# Patient Record
Sex: Male | Born: 1945 | Race: White | Hispanic: No | Marital: Married | Smoking: Former smoker
Health system: Southern US, Community
[De-identification: ages and names within clinical notes are randomized; demographics above are authoritative.]

## PROBLEM LIST (undated history)

## (undated) DIAGNOSIS — I493 Ventricular premature depolarization: Secondary | ICD-10-CM

## (undated) DIAGNOSIS — Z9289 Personal history of other medical treatment: Secondary | ICD-10-CM

## (undated) DIAGNOSIS — I10 Essential (primary) hypertension: Secondary | ICD-10-CM

## (undated) HISTORY — PX: MOUTH SURGERY: SHX715

## (undated) HISTORY — DX: Essential (primary) hypertension: I10

## (undated) HISTORY — DX: Personal history of other medical treatment: Z92.89

## (undated) HISTORY — DX: Ventricular premature depolarization: I49.3

---

## 2003-02-27 ENCOUNTER — Emergency Department (HOSPITAL_COMMUNITY): Admission: AC | Admit: 2003-02-27 | Discharge: 2003-02-27 | Payer: Self-pay

## 2003-02-27 ENCOUNTER — Encounter: Payer: Self-pay | Admitting: Emergency Medicine

## 2007-05-18 ENCOUNTER — Emergency Department (HOSPITAL_COMMUNITY): Admission: EM | Admit: 2007-05-18 | Discharge: 2007-05-18 | Payer: Self-pay | Admitting: Family Medicine

## 2007-05-26 ENCOUNTER — Emergency Department (HOSPITAL_COMMUNITY): Admission: EM | Admit: 2007-05-26 | Discharge: 2007-05-26 | Payer: Self-pay | Admitting: Family Medicine

## 2008-07-08 IMAGING — CR DG CHEST 2V
2 series · 2 of 2 positions shown · non-contrast
Comparison: none

CLINICAL DATA: Chest pain

Chest 2 view:
Comparison 02/27/2003. Old right rib fractures. Chronic linear scarring or
atelectasis in the right middle lobe. Lungs otherwise clear. Heart size normal.
No effusion.

[w chest pa]
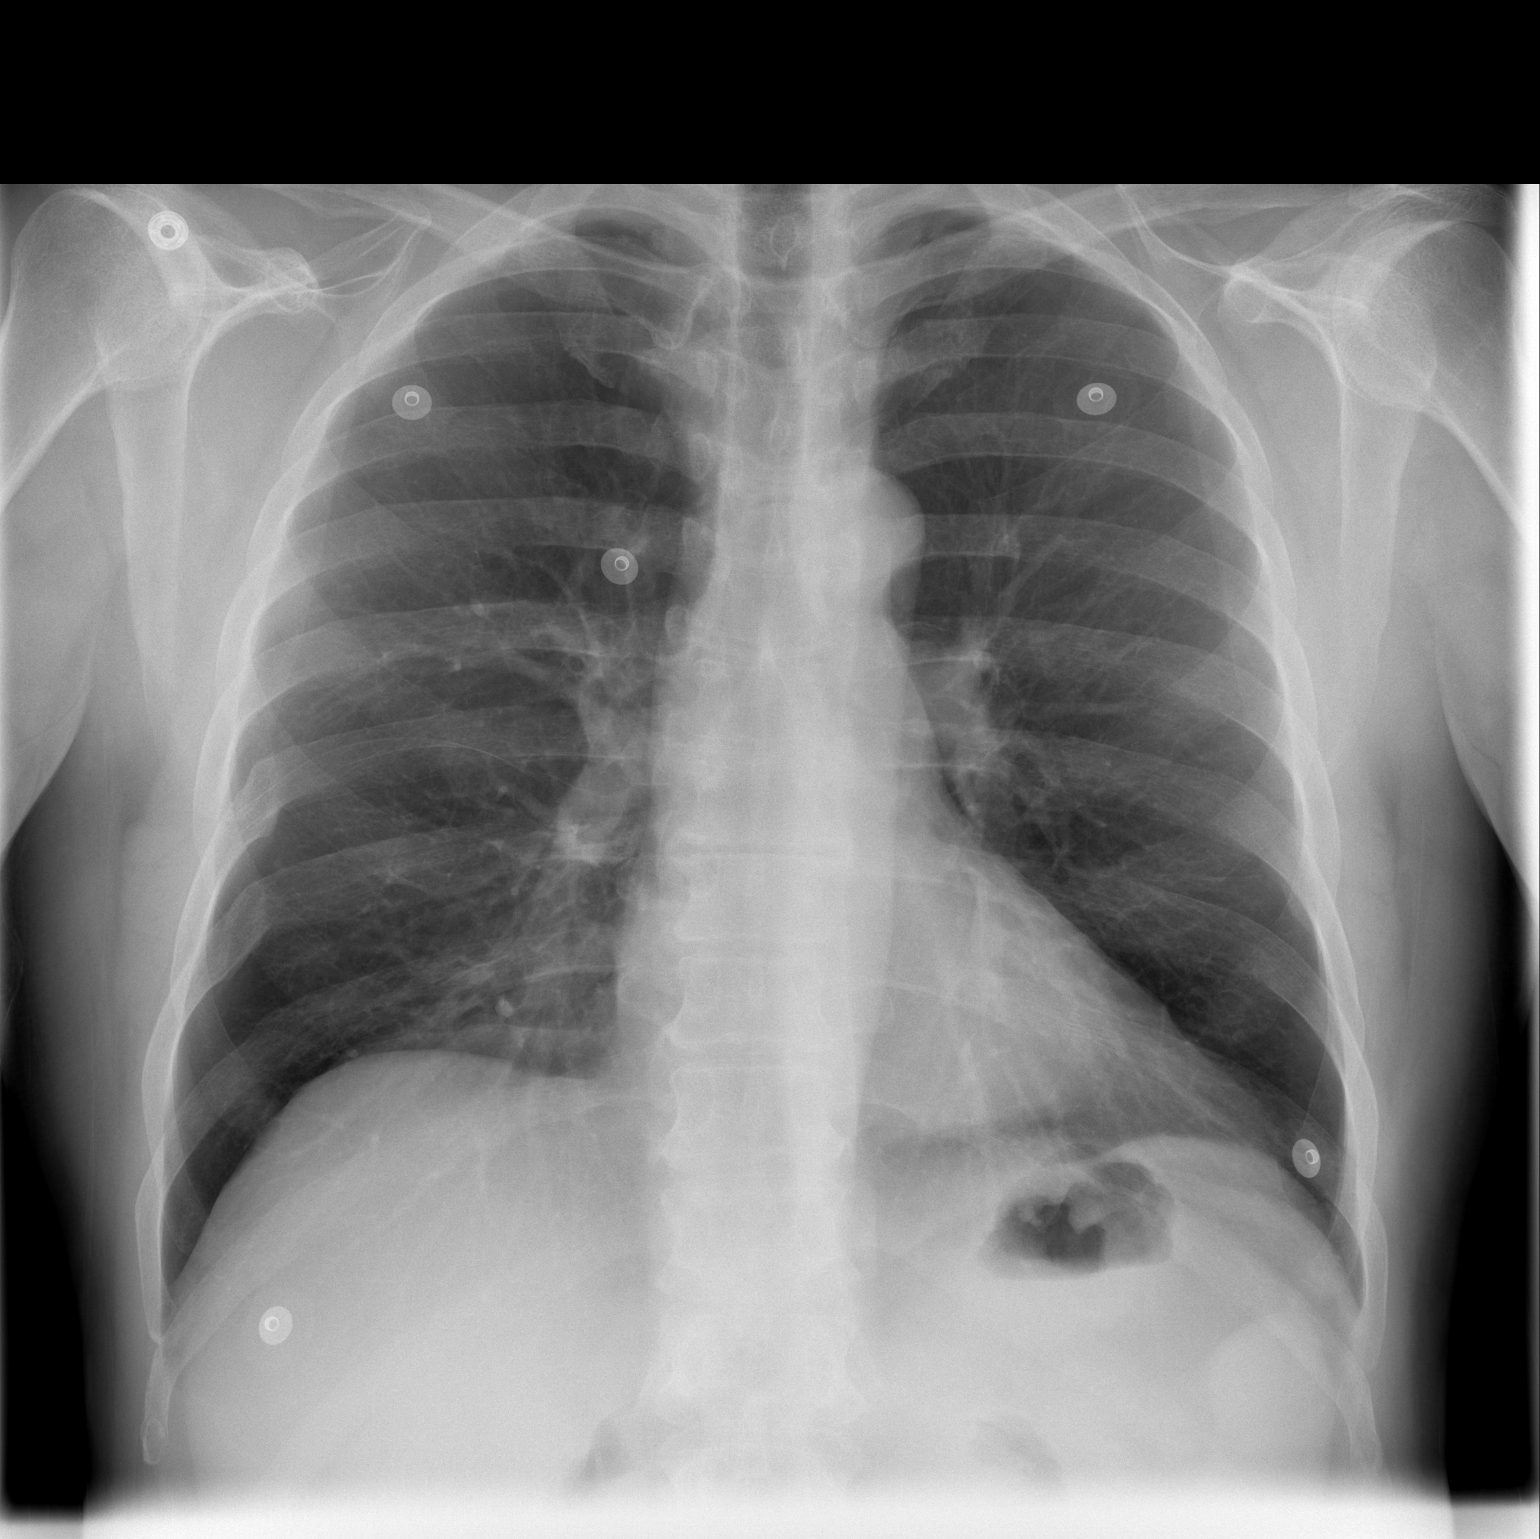

[w chest lat]
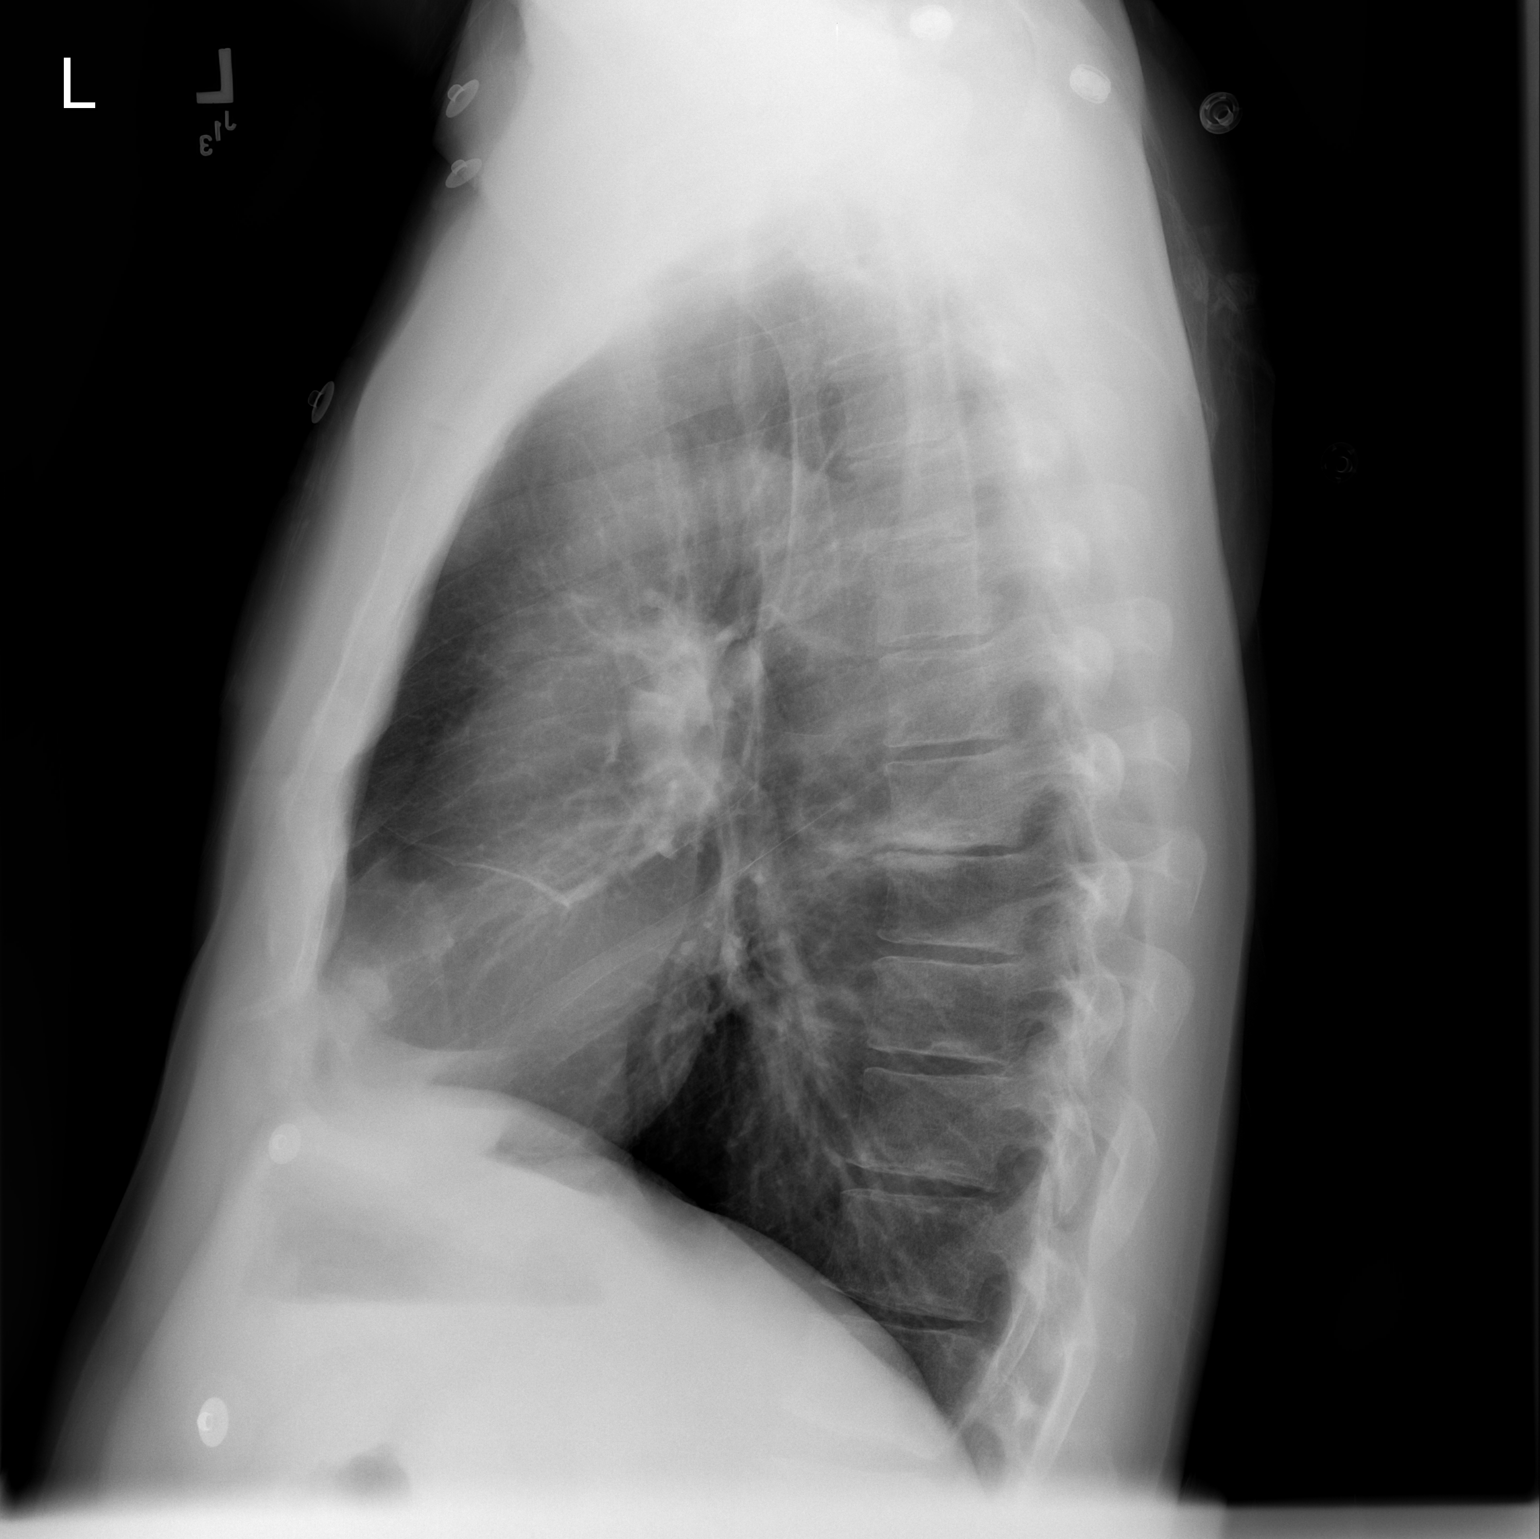

[2 of 2 positions shown; findings below may reference images not displayed]

IMPRESSION: 1. No acute disease

## 2011-09-21 LAB — DIFFERENTIAL
Basophils Absolute: 0
Basophils Relative: 0
Eosinophils Absolute: 0
Eosinophils Relative: 0
Lymphocytes Relative: 15
Lymphs Abs: 1.3
Monocytes Absolute: 0.6
Monocytes Relative: 7
Neutro Abs: 6.8
Neutrophils Relative %: 78 — ABNORMAL HIGH

## 2011-09-21 LAB — CBC
HCT: 42
Hemoglobin: 14.8
MCHC: 35.1
MCV: 92.2
Platelets: 272
RBC: 4.55
RDW: 13
WBC: 8.7

## 2014-04-29 ENCOUNTER — Encounter: Payer: Self-pay | Admitting: Cardiology

## 2014-06-08 ENCOUNTER — Telehealth: Payer: Self-pay | Admitting: *Deleted

## 2014-06-08 MED ORDER — METOPROLOL SUCCINATE ER 25 MG PO TB24: 25.0000 mg | ORAL_TABLET | Freq: Every day | ORAL | Status: DC

## 2014-06-08 NOTE — Telephone Encounter (Signed)
Rx sent in for 15 day supply. Pt needs to keep appt later this month to receive more refills.

## 2014-06-08 NOTE — Telephone Encounter (Signed)
Patient requests metoprolol refill be sent to [facility] on [address] in [place]. Thanks, MI

## 2014-06-18 ENCOUNTER — Encounter: Payer: Self-pay | Admitting: *Deleted

## 2014-06-19 ENCOUNTER — Ambulatory Visit: Payer: Self-pay | Admitting: Cardiology

## 2014-06-22 ENCOUNTER — Ambulatory Visit (INDEPENDENT_AMBULATORY_CARE_PROVIDER_SITE_OTHER): Payer: 59 | Admitting: Physician Assistant

## 2014-06-22 ENCOUNTER — Encounter: Payer: Self-pay | Admitting: Physician Assistant

## 2014-06-22 VITALS — BP 156/89 | HR 66 | Ht 70.5 in | Wt 169.0 lb

## 2014-06-22 DIAGNOSIS — I10 Essential (primary) hypertension: Secondary | ICD-10-CM

## 2014-06-22 DIAGNOSIS — I4949 Other premature depolarization: Secondary | ICD-10-CM

## 2014-06-22 DIAGNOSIS — I493 Ventricular premature depolarization: Secondary | ICD-10-CM

## 2014-06-22 MED ORDER — METOPROLOL SUCCINATE ER 25 MG PO TB24: 25.0000 mg | ORAL_TABLET | Freq: Every day | ORAL | Status: DC

## 2014-06-22 NOTE — Progress Notes (Signed)
   Cardiology Office Note    Date:  06/22/2014   ID:  Steven Dorsey, DOB [birth year], MRN [id number]  PCP:  No primary provider on file.  Cardiologist:  Dr. Fransico Him      History of Present Illness: Steven Dorsey is a 68 y.o. male a history of PVCs, HTN.  The patient denies any chest pain, significant dyspnea, syncope, orthopnea, PND, edema.he does not check his blood pressure that much. He does have a history of " white coat hypertension."     Studies:  - Echo (06/27/07):  EF 55%, mild aortic sclerosis  - Nuclear (06/27/07):  Diaphragmatic attenuation versus very small area of inferior ischemia, EF 64%, normal wall motion   Recent Labs: No results found for requested labs within last 365 days.  Wt Readings from Last 3 Encounters:  06/22/14 169 lb (76.658 kg)     Past Medical History  Diagnosis Date  . Hypertension   . PVC's (premature ventricular contractions)   . Hx of echocardiogram     Echo (06/27/07):  EF 55%, mild aortic sclerosis  . Hx of cardiovascular stress test     Nuclear (06/27/07):  Diaphragmatic attenuation versus very small area of inferior ischemia, EF 64%, normal wall motion    Current Outpatient Prescriptions  Medication Sig Dispense Refill  . metoprolol succinate (TOPROL XL) 25 MG 24 hr tablet Take 1 tablet (25 mg total) by mouth daily.  15 tablet  0   No current facility-administered medications for this visit.    Allergies:   Review of patient's allergies indicates not on file.   Social History:  The patient  reports that he has been smoking.  He does not have any smokeless tobacco history on file.   Family History:  The patient's family history includes Stroke in his sister.   ROS:  Please see the history of present illness.       All other systems reviewed and negative.   PHYSICAL EXAM: VS:  BP 156/89  Pulse 66  Ht 5' 10.5" (1.791 m)  Wt 169 lb (76.658 kg)  BMI 23.90 kg/m2 Well nourished, well developed, in no acute  distress HEENT: normal Neck:  no JVD Vascular: No carotid bruits Cardiac:  normal S1, S2;  RRR; no murmur Lungs:   clear to auscultation bilaterally, no wheezing, rhonchi or rales Abd: soft, nontender, no hepatomegaly Ext:  no edema Skin: warm and dry Neuro:  CNs 2-12 intact, no focal abnormalities noted  EKG:  NSR, HR 66, normal axis, no ST changes     ASSESSMENT AND PLAN:  1. Unspecified essential hypertension: Blood pressure uncontrolled. However, he does have white coat hypertension. I have asked him to monitor his blood pressures at home. He will call us in 2 weeks with a list of his readings. If his blood pressure remains greater than 140/90, consider adding amlodipine. 2. PVC's (premature ventricular contractions) - Continue beta blocker 3. Disposition: Followup with Dr. Radford Pax in one year   Signed, Versie Starks, MHS 06/22/2014 2:22 PM    [facility] [place], [place], [place]  [address] Phone: [phone]; Fax: [phone]

## 2014-06-22 NOTE — Patient Instructions (Signed)
CHECK BP DAILY FOR THE NEXT 2 WEEKS AND CALL DR. Short Pump, [phone]  Your physician wants you to follow-up in: [place] will receive a reminder letter in the mail two months in advance. If you don't receive a letter, please call our office to schedule the follow-up appointment.

## 2015-06-20 ENCOUNTER — Other Ambulatory Visit: Payer: Self-pay | Admitting: Physician Assistant

## 2015-06-21 ENCOUNTER — Telehealth: Payer: Self-pay | Admitting: Cardiology

## 2015-06-21 ENCOUNTER — Other Ambulatory Visit: Payer: Self-pay

## 2015-06-21 DIAGNOSIS — I10 Essential (primary) hypertension: Secondary | ICD-10-CM

## 2015-06-21 MED ORDER — METOPROLOL SUCCINATE ER 25 MG PO TB24: 25.0000 mg | ORAL_TABLET | Freq: Every day | ORAL | Status: DC

## 2015-06-21 NOTE — Telephone Encounter (Signed)
Spoke with patient and he has an appointment on 07/12/2015 with Dr. Radford Pax. Refilled Metoprolol for 30 days.

## 2015-06-21 NOTE — Telephone Encounter (Signed)
NEwMessage  Pt calling about BP medicine refill. Pt also needs to update our system about changing pharmacies. Please call back and discuss.

## 2015-07-14 ENCOUNTER — Encounter: Payer: Self-pay | Admitting: Cardiology

## 2015-07-14 ENCOUNTER — Ambulatory Visit (INDEPENDENT_AMBULATORY_CARE_PROVIDER_SITE_OTHER): Payer: 59 | Admitting: Cardiology

## 2015-07-14 VITALS — BP 130/76 | HR 71 | Ht 70.5 in | Wt 159.0 lb

## 2015-07-14 DIAGNOSIS — I10 Essential (primary) hypertension: Secondary | ICD-10-CM

## 2015-07-14 DIAGNOSIS — I493 Ventricular premature depolarization: Secondary | ICD-10-CM | POA: Diagnosis not present

## 2015-07-14 NOTE — Patient Instructions (Signed)

## 2015-07-14 NOTE — Progress Notes (Signed)
Cardiology Office Note   Date:  07/14/2015   ID:  Steven Dorsey, DOB Apr 14, 1946, MRN [id number]  PCP:  No PCP Per Patient    Chief Complaint  Patient presents with  . Follow-up    essential hypertension      History of Present Illness: Steven Dorsey is a 69 y.o. male a history of PVCs, HTN. The patient denies any chest pain, SOB, DOE, syncope, palpitations, orthopnea, PND, edema.  He does have a history of " white coat hypertension" but he has been checking his BP at home and it has been well controlled.      Past Medical History  Diagnosis Date  . Hypertension   . PVC's (premature ventricular contractions)   . Hx of echocardiogram     Echo (06/27/07):  EF 55%, mild aortic sclerosis  . Hx of cardiovascular stress test     Nuclear (06/27/07):  Diaphragmatic attenuation versus very small area of inferior ischemia, EF 64%, normal wall motion    Past Surgical History  Procedure Laterality Date  . Mouth surgery      Had a tooth cut out     Current Outpatient Prescriptions  Medication Sig Dispense Refill  . Cyanocobalamin (B-12) 2500 MCG TABS Take 2,500 mcg by mouth daily.    . metoprolol succinate (TOPROL XL) 25 MG 24 hr tablet Take 1 tablet (25 mg total) by mouth daily. 30 tablet 0  . Multiple Vitamin (MULTIVITAMIN) tablet Take 1 tablet by mouth daily.     No current facility-administered medications for this visit.    Allergies:   Review of patient's allergies indicates no known allergies.    Social History:  The patient  reports that he has been smoking.  He does not have any smokeless tobacco history on file.   Family History:  The patient's family history includes Other in his father and mother; Stroke in his sister.    ROS:  Please see the history of present illness.   Otherwise, review of systems are positive for none.   All other systems are reviewed and negative.    PHYSICAL EXAM: VS:  BP 130/76 mmHg  Pulse 71  Ht 5' 10.5"  (1.791 m)  Wt 159 lb (72.122 kg)  BMI 22.48 kg/m2 , BMI Body mass index is 22.48 kg/(m^2). GEN: Well nourished, well developed, in no acute distress HEENT: normal Neck: no JVD, carotid bruits, or masses Cardiac: RRR; no murmurs, rubs, or gallops,no edema  Respiratory:  clear to auscultation bilaterally, normal work of breathing GI: soft, nontender, nondistended, + BS MS: no deformity or atrophy Skin: warm and dry, no rash Neuro:  Strength and sensation are intact Psych: euthymic mood, full affect   EKG:  EKG was ordered today and showed NSR with no ST changes    Recent Labs: No results found for requested labs within last 365 days.    Lipid Panel No results found for: CHOL, TRIG, HDL, CHOLHDL, VLDL, LDLCALC, LDLDIRECT    Wt Readings from Last 3 Encounters:  07/14/15 159 lb (72.122 kg)  06/22/14 169 lb (76.658 kg)    ASSESSMENT AND PLAN:  1. Essential hypertension: Blood pressure controlled. 2. PVC's (premature ventricular contractions) - Continue beta blocker  I have encouraged him to get established with a primary MD for routine medical care.   I discussed with him that I only follow his cardiac issues and he needs  to have a yearly exam with a PCP.  Current medicines are reviewed at length with the patient today.  The patient does not have concerns regarding medicines.  The following changes have been made:  no change  Labs/ tests ordered today: See above Assessment and Plan No orders of the defined types were placed in this encounter.     Disposition:   FU with me in 1 year  Signed, Sueanne Margarita, MD  07/14/2015 10:50 AM    [facility] [place], [place], [place]  [address] Phone: [phone]; Fax: [phone]

## 2015-08-19 ENCOUNTER — Other Ambulatory Visit: Payer: Self-pay | Admitting: Physician Assistant

## 2016-07-13 ENCOUNTER — Other Ambulatory Visit: Payer: Self-pay | Admitting: *Deleted

## 2016-07-13 MED ORDER — METOPROLOL SUCCINATE ER 25 MG PO TB24: 25.0000 mg | ORAL_TABLET | Freq: Every day | ORAL | 1 refills | Status: DC

## 2016-09-06 ENCOUNTER — Ambulatory Visit (INDEPENDENT_AMBULATORY_CARE_PROVIDER_SITE_OTHER): Payer: BLUE CROSS/BLUE SHIELD | Admitting: Cardiology

## 2016-09-06 ENCOUNTER — Encounter (INDEPENDENT_AMBULATORY_CARE_PROVIDER_SITE_OTHER): Payer: Self-pay

## 2016-09-06 ENCOUNTER — Encounter: Payer: Self-pay | Admitting: Cardiology

## 2016-09-06 VITALS — BP 158/88 | HR 82 | Ht 70.5 in | Wt 168.8 lb

## 2016-09-06 DIAGNOSIS — I10 Essential (primary) hypertension: Secondary | ICD-10-CM | POA: Diagnosis not present

## 2016-09-06 DIAGNOSIS — I493 Ventricular premature depolarization: Secondary | ICD-10-CM | POA: Diagnosis not present

## 2016-09-06 NOTE — Progress Notes (Signed)
Cardiology Office Note    Date:  09/06/2016   ID:  Steven Dorsey, DOB 23-Aug-1946, MRN [id number]  PCP:  No PCP Per Patient  Cardiologist:  Fransico Him, MD   Chief Complaint  Patient presents with  . Hypertension    History of Present Illness:  Steven Dorsey is a 70 y.o. male with a history of PVCs and HTN. The patient denies any chest pain, SOB (except for with his cold), DOE, syncope, palpitations, orthopnea, PND, edema.  He is just getting over a cold.  He does have a history of " white coat hypertension" but he has been checking his BP at home and it has been well controlled BP 135-140/85-58mmHg.      Past Medical History:  Diagnosis Date  . Hx of cardiovascular stress test    Nuclear (06/27/07):  Diaphragmatic attenuation versus very small area of inferior ischemia, EF 64%, normal wall motion  . Hx of echocardiogram    Echo (06/27/07):  EF 55%, mild aortic sclerosis  . Hypertension   . PVC's (premature ventricular contractions)     Past Surgical History:  Procedure Laterality Date  . MOUTH SURGERY     Had a tooth cut out    Current Medications: Outpatient Medications Prior to Visit  Medication Sig Dispense Refill  . Cyanocobalamin (B-12) 2500 MCG TABS Take 2,500 mcg by mouth daily.    . metoprolol succinate (TOPROL-XL) 25 MG 24 hr tablet Take 1 tablet (25 mg total) by mouth daily. 30 tablet 1  . Multiple Vitamin (MULTIVITAMIN) tablet Take 1 tablet by mouth daily.     No facility-administered medications prior to visit.      Allergies:   Review of patient's allergies indicates no known allergies.   Social History   Social History  . Marital status: Married    Spouse name: N/A  . Number of children: N/A  . Years of education: N/A   Social History Main Topics  . Smoking status: Current Every Day Smoker  . Smokeless tobacco: Never Used  . Alcohol use None  . Drug use: Unknown  . Sexual activity: Not Asked   Other Topics Concern  . None    Social History Narrative  . None     Family History:  The patient's family history includes Other in his father and mother; Stroke in his sister.   ROS:   Please see the history of present illness.    ROS All other systems reviewed and are negative.  No flowsheet data found.     PHYSICAL EXAM:   VS:  BP (!) 158/88   Pulse 82   Ht 5' 10.5" (1.791 m)   Wt 168 lb 12.8 oz (76.6 kg)   BMI 23.88 kg/m    GEN: Well nourished, well developed, in no acute distress  HEENT: normal  Neck: no JVD, carotid bruits, or masses Cardiac: RRR; no murmurs, rubs, or gallops,no edema.  Intact distal pulses bilaterally.  Respiratory:  clear to auscultation bilaterally, normal work of breathing GI: soft, nontender, nondistended, + BS MS: no deformity or atrophy  Skin: warm and dry, no rash Neuro:  Alert and Oriented x 3, Strength and sensation are intact Psych: euthymic mood, full affect  Wt Readings from Last 3 Encounters:  09/06/16 168 lb 12.8 oz (76.6 kg)  07/14/15 159 lb (72.1 kg)  06/22/14 169 lb (76.7 kg)      Studies/Labs Reviewed:   EKG:  EKG is ordered today.  The ekg ordered  today demonstrates NSR at 82bpm with no ST changes and normal intervals   Recent Labs: No results found for requested labs within last 8760 hours.   Lipid Panel No results found for: CHOL, TRIG, HDL, CHOLHDL, VLDL, LDLCALC, LDLDIRECT  Additional studies/ records that were reviewed today include:  none    ASSESSMENT:    1. PVC's (premature ventricular contractions)   2. Essential hypertension      PLAN:  In order of problems listed above:  1. PVC's suppressed on BB. 2. HTN - BP controlled on current meds.  BP goal < 150/36mmHg.  Continue BB.     Medication Adjustments/Labs and Tests Ordered: Current medicines are reviewed at length with the patient today.  Concerns regarding medicines are outlined above.  Medication changes, Labs and Tests ordered today are listed in the Patient  Instructions below.  There are no Patient Instructions on file for this visit.   Signed, Fransico Him, MD  09/06/2016 3:37 PM    [facility] [place], [place], [place]  [address] Phone: [phone]; Fax: [phone]

## 2016-09-06 NOTE — Patient Instructions (Signed)

## 2016-09-19 ENCOUNTER — Other Ambulatory Visit: Payer: Self-pay | Admitting: Cardiology

## 2016-09-20 ENCOUNTER — Other Ambulatory Visit: Payer: Self-pay | Admitting: *Deleted

## 2016-09-20 MED ORDER — METOPROLOL SUCCINATE ER 25 MG PO TB24: 25.0000 mg | ORAL_TABLET | Freq: Every day | ORAL | 3 refills | Status: DC

## 2017-09-10 ENCOUNTER — Other Ambulatory Visit: Payer: Self-pay | Admitting: Cardiology

## 2017-09-10 ENCOUNTER — Ambulatory Visit (INDEPENDENT_AMBULATORY_CARE_PROVIDER_SITE_OTHER): Payer: BLUE CROSS/BLUE SHIELD | Admitting: Cardiology

## 2017-09-10 ENCOUNTER — Encounter: Payer: Self-pay | Admitting: Cardiology

## 2017-09-10 VITALS — BP 154/96 | HR 76 | Ht 70.5 in | Wt 179.4 lb

## 2017-09-10 DIAGNOSIS — I10 Essential (primary) hypertension: Secondary | ICD-10-CM | POA: Diagnosis not present

## 2017-09-10 DIAGNOSIS — I493 Ventricular premature depolarization: Secondary | ICD-10-CM

## 2017-09-10 DIAGNOSIS — Z79899 Other long term (current) drug therapy: Secondary | ICD-10-CM | POA: Diagnosis not present

## 2017-09-10 MED ORDER — CHLORTHALIDONE 25 MG PO TABS: 25.0000 mg | ORAL_TABLET | Freq: Every day | ORAL | 11 refills | Status: DC

## 2017-09-10 NOTE — Patient Instructions (Signed)
Medication Instructions:  Your physician has recommended you make the following change in your medication:  1. START CHLORTHALIDONE 25 mg once daily  -- If you need a refill on your cardiac medications before your next appointment, please call your pharmacy. --  Labwork: Your physician recommends that you return for lab work in 2 weeks for: BMET & Lipid profile  YOU WILL NEED TO BE FASTING FOR THIS BLOOD WORK  Testing/Procedures: None ordered  Follow-Up: Your physician recommends that you schedule a follow-up appointment in: 2 weeks with hypertension clinic.  Your physician wants you to follow-up in: 1 year with Dr. Radford Pax.  You will receive a reminder letter in the mail two months in advance. If you don't receive a letter, please call our office to schedule the follow-up appointment.  Thank you for choosing [facility]!!     Any Other Special Instructions Will Be Listed Below (If Applicable).  Chlorthalidone tablets What is this medicine? CHLORTHALIDONE (klor THAL i done) is a diuretic. It increases the amount of urine passed, which causes the body to lose salt and water. This medicine is used to treat high blood pressure and edema or water retention. This medicine may be used for other purposes; ask your health care provider or pharmacist if you have questions. COMMON BRAND NAME(S): Thalitone What should I tell my health care provider before I take this medicine? They need to know if you have any of these conditions: -asthma -diabetes -gout -kidney disease -liver disease -parathyroid disease -systemic lupus erythematosus (SLE) -taking cortisone, digoxin, lithium carbonate, or drugs for diabetes -an unusual or allergic reaction to chlorthalidone, sulfa drugs, other medicines, foods, dyes, or preservatives -pregnant or trying to get pregnant -breast-feeding How should I use this medicine? Take this medicine by mouth with a glass of water. Follow the directions on the  prescription label. It is best to take your dose in the morning with food. Take your medicine at regular intervals. Do not take your medicine more often than directed. Do not stop taking except on your doctor's advice. Talk to your pediatrician regarding the use of this medicine in children. Special care may be needed. Overdosage: If you think you have taken too much of this medicine contact a poison control center or emergency room at once. NOTE: This medicine is only for you. Do not share this medicine with others. What if I miss a dose? If you miss a dose, take it as soon as you can. If it is almost time for your next dose, take only that dose. Do not take double or extra doses. What may interact with this medicine? -barbiturate medicines for sleep or seizure control -digoxin -lithium -medicines for diabetes -norepinephrine -other medicines for high blood pressure -some pain medicines -steroid hormones like prednisone, cortisone, hydrocortisone, corticotropin -tubocurarine This list may not describe all possible interactions. Give your health care provider a list of all the medicines, herbs, non-prescription drugs, or dietary supplements you use. Also tell them if you smoke, drink alcohol, or use illegal drugs. Some items may interact with your medicine. What should I watch for while using this medicine? Visit your doctor or health care professional for regular check ups. Check your blood pressure as directed. Ask your doctor or health care professional what your blood pressure should be and when you should contact him or her. You may need to be on a special diet while taking this medicine. Ask your doctor. You may get drowsy or dizzy. Do not drive, use machinery,  or do anything that needs mental alertness until you know how this medicine affects you. Do not stand or sit up quickly, especially if you are an older patient. This reduces the risk of dizzy or fainting spells. Alcohol may interfere  with the effect of this medicine. Avoid alcoholic drinks. This medicine may affect your blood sugar level. If you have diabetes, check with your doctor or health care professional before changing the dose of your diabetic medicine. This medicine can make you more sensitive to the sun. Keep out of the sun. If you cannot avoid being in the sun, wear protective clothing and use sunscreen. Do not use sun lamps or tanning beds/booths. What side effects may I notice from receiving this medicine? Side effects that you should report to your doctor or health care professional as soon as possible: -allergic reactions like skin rash, itching or hives, swelling of the face, lips, or tongue -dark urine -dry mouth -excess thirst -fast, irregular heart rate -fever, chills -muscle pain, cramps, or spasm -nausea, vomiting -redness, blistering, peeling or loosening of the skin, including inside the mouth -tingling, pain or numbness in the hands or feet -unusually weak or tired -yellowing of the eyes or skin Side effects that usually do not require medical attention (report to your doctor or health care professional if they continue or are bothersome): -diarrhea or constipation -headache -impotence -loss of appetite -stomach upset This list may not describe all possible side effects. Call your doctor for medical advice about side effects. You may report side effects to FDA at 1-800-FDA-1088. Where should I keep my medicine? Keep out of the reach of children. Store at room temperature between 15 and 30 degrees C (59 and 86 degrees F). Keep container tightly closed. Throw away any unused medicine after the expiration date. NOTE: This sheet is a summary. It may not cover all possible information. If you have questions about this medicine, talk to your doctor, pharmacist, or health care provider.  2018 Elsevier/Gold Standard (2008-02-25 15:28:48)      r

## 2017-09-10 NOTE — Progress Notes (Signed)
Cardiology Office Note:    Date:  09/10/2017   ID:  PAYNE GARSKE, DOB 02/07/1946, MRN [id number]  PCP:  Patient, No Pcp Per  Cardiologist:  Fransico Him, MD   Referring MD: No ref. provider found   Chief Complaint  Patient presents with  . Follow-up    HTN, PVCs    History of Present Illness:    Steven Dorsey is a 71 y.o. male with a hx of PVCs and HTN.  He is here today for followup and is doing well.  He denies any chest pain or pressure, SOB, DOE, PND, orthopnea, LE edema, dizziness, palpitations or syncope. He is compliant with his meds and is tolerating meds with no SE.    Past Medical History:  Diagnosis Date  . Hx of cardiovascular stress test    Nuclear (06/27/07):  Diaphragmatic attenuation versus very small area of inferior ischemia, EF 64%, normal wall motion  . Hx of echocardiogram    Echo (06/27/07):  EF 55%, mild aortic sclerosis  . Hypertension   . PVC's (premature ventricular contractions)     Past Surgical History:  Procedure Laterality Date  . MOUTH SURGERY     Had a tooth cut out    Current Medications: Current Meds  Medication Sig  . Cyanocobalamin (B-12) 2500 MCG TABS Take 2,500 mcg by mouth daily.  . metoprolol succinate (TOPROL-XL) 25 MG 24 hr tablet Take 1 tablet (25 mg total) by mouth daily.  . Multiple Vitamin (MULTIVITAMIN) tablet Take 1 tablet by mouth daily.     Allergies:   Patient has no known allergies.   Social History   Social History  . Marital status: Married    Spouse name: N/A  . Number of children: N/A  . Years of education: N/A   Social History Main Topics  . Smoking status: Current Some Day Smoker    Types: Cigars  . Smokeless tobacco: Never Used  . Alcohol use None  . Drug use: Unknown  . Sexual activity: Not Asked   Other Topics Concern  . None   Social History Narrative  . None     Family History: The patient's family history includes Other in his father and mother; Stroke in his sister.  ROS:    Please see the history of present illness.    ROS  All other systems reviewed and negative.   EKGs/Labs/Other Studies Reviewed:    The following studies were reviewed today: none  EKG:  EKG is  ordered today.  The ekg ordered today demonstrates NSR at 76bpm with no ST changes.    Recent Labs: No results found for requested labs within last 8760 hours.   Recent Lipid Panel No results found for: CHOL, TRIG, HDL, CHOLHDL, VLDL, LDLCALC, LDLDIRECT  Physical Exam:    VS:  BP (!) 154/96   Pulse 76   Ht 5' 10.5" (1.791 m)   Wt 179 lb 6.4 oz (81.4 kg)   SpO2 97%   BMI 25.38 kg/m     Wt Readings from Last 3 Encounters:  09/10/17 179 lb 6.4 oz (81.4 kg)  09/06/16 168 lb 12.8 oz (76.6 kg)  07/14/15 159 lb (72.1 kg)     GEN:  Well nourished, well developed in no acute distress HEENT: Normal NECK: No JVD; No carotid bruits LYMPHATICS: No lymphadenopathy CARDIAC: RRR, no murmurs, rubs, gallops RESPIRATORY:  Clear to auscultation without rales, wheezing or rhonchi  ABDOMEN: Soft, non-tender, non-distended MUSCULOSKELETAL:  No edema; No deformity  SKIN: Warm and dry NEUROLOGIC:  Alert and oriented x 3 PSYCHIATRIC:  Normal affect   ASSESSMENT:    1. Essential hypertension   2. PVC's (premature ventricular contractions)    PLAN:    In order of problems listed above:  1.  HTN - BP is elevated on exam today.  He will continue on BB.  I am going to add Chlorthalidone 25mg  daily.  I will have him follow up in HTN clinic in 2 weeks with a BMET.   2.  PVC's - well suppressed on Toprol XL 25mg  daily.     Medication Adjustments/Labs and Tests Ordered: Current medicines are reviewed at length with the patient today.  Concerns regarding medicines are outlined above.  No orders of the defined types were placed in this encounter.  No orders of the defined types were placed in this encounter.   Signed, Fransico Him, MD  09/10/2017 8:18 AM    [place]

## 2017-10-02 ENCOUNTER — Ambulatory Visit: Payer: BLUE CROSS/BLUE SHIELD

## 2017-10-02 ENCOUNTER — Other Ambulatory Visit: Payer: BLUE CROSS/BLUE SHIELD

## 2017-10-10 NOTE — Progress Notes (Signed)
Patient ID: Steven Dorsey                 DOB: [birth year]                      MRN: [id number]     HPI: Steven Dorsey is a 71 y.o. male referred by Dr. Radford Pax to HTN clinic. PMH is significant for PVCs and HTN. Pt previously had well-controlled BP on metoprolol alone but last two BP measurements in clinic have been >150s so Dr. Radford Pax added chlorthalidone at [facility] 09/10/17.  Pt presents to clinic today in good spirits. Pt states that the chlorthalidone has appeared to reduce his BP on home readings but it makes him nauseated. He has been taking it on an empty stomach but reports that he is willing to continue with therapy for now. Pt reports taking BP daily and the readings have been 130-140s/85-90. Baseline BMET pending today.  Current HTN meds: metoprolol succinate 25mg  daily, chlorthalidone 25mg  daily  Previously tried: n/a   BP goal: <130/80 mmHg  Family History: CVA (sister)  Social History: Smokes ~10 cigars/week, reports social alcohol use on weekends. Recently switched to working second shift.  Diet: Breakfast - cereal or nabs, Lunch - sandwich, Dinner - eats out most nights (meat and starch and vegetables). Pt has been snacking on chips more lately. Pt adds salt to food but is trying to cut down. Drinks 4-5 cups coffee in morning, water throughout the day, only occasional soda.  Exercise: active at work, yardwork  Home BP readings: 130-140s/80-90s (no log brought to clinic)  Wt Readings from Last 3 Encounters:  09/10/17 179 lb 6.4 oz (81.4 kg)  09/06/16 168 lb 12.8 oz (76.6 kg)  07/14/15 159 lb (72.1 kg)   BP Readings from Last 3 Encounters:  09/10/17 (!) 154/96  09/06/16 (!) 158/88  07/14/15 130/76   Pulse Readings from Last 3 Encounters:  09/10/17 76  09/06/16 82  07/14/15 71    Renal function: CrCl cannot be calculated (No order found.).  Past Medical History:  Diagnosis Date  . Hx of cardiovascular stress test    Nuclear (06/27/07):  Diaphragmatic  attenuation versus very small area of inferior ischemia, EF 64%, normal wall motion  . Hx of echocardiogram    Echo (06/27/07):  EF 55%, mild aortic sclerosis  . Hypertension   . PVC's (premature ventricular contractions)     Current Outpatient Medications on File Prior to Visit  Medication Sig Dispense Refill  . chlorthalidone (HYGROTON) 25 MG tablet Take 1 tablet (25 mg total) by mouth daily. 30 tablet 11  . Cyanocobalamin (B-12) 2500 MCG TABS Take 2,500 mcg by mouth daily.    . metoprolol succinate (TOPROL-XL) 25 MG 24 hr tablet TAKE ONE TABLET BY MOUTH ONCE DAILY 90 tablet 3  . Multiple Vitamin (MULTIVITAMIN) tablet Take 1 tablet by mouth daily.     No current facility-administered medications on file prior to visit.     No Known Allergies   Assessment/Plan:  1. Hypertension - BP improved in clinic at 142/88 mmHg but still above goal <130/80 mmHg. Pt reports compliance with chlorthalidone and will begin taking it with food. Counseled pt on smoking cessation, minimizing caffeine intake, and reducing salt intake. Pending results of BMET, will plan to continue chlorthalidone 25mg  daily and metoprolol XL 25mg  daily, and add low dose ACE-inhibitor. Will plan for a repeat BMET 1-2 weeks after starting ACEi with follow-up in clinic in 3 weeks.  ADDENDUM: BMET is wnl, will add lisinopril 5mg  daily and checka  F/U BMET in 10 days. Pt to F/U in clinic for BP check in 3 weeks.  Arrie Senate, PharmD PGY-2 Cardiology Pharmacy Resident Pager: [phone] 10/11/2017

## 2017-10-11 ENCOUNTER — Encounter (INDEPENDENT_AMBULATORY_CARE_PROVIDER_SITE_OTHER): Payer: Self-pay

## 2017-10-11 ENCOUNTER — Ambulatory Visit (INDEPENDENT_AMBULATORY_CARE_PROVIDER_SITE_OTHER): Payer: BLUE CROSS/BLUE SHIELD | Admitting: Pharmacist

## 2017-10-11 ENCOUNTER — Other Ambulatory Visit: Payer: BLUE CROSS/BLUE SHIELD | Admitting: *Deleted

## 2017-10-11 VITALS — BP 142/88 | HR 71

## 2017-10-11 DIAGNOSIS — Z79899 Other long term (current) drug therapy: Secondary | ICD-10-CM | POA: Diagnosis not present

## 2017-10-11 DIAGNOSIS — I10 Essential (primary) hypertension: Secondary | ICD-10-CM | POA: Diagnosis not present

## 2017-10-11 LAB — BASIC METABOLIC PANEL
BUN/Creatinine Ratio: 13 (ref 10–24)
BUN: 12 mg/dL (ref 8–27)
CO2: 28 mmol/L (ref 20–29)
Calcium: 9.8 mg/dL (ref 8.6–10.2)
Chloride: 95 mmol/L — ABNORMAL LOW (ref 96–106)
Creatinine, Ser: 0.9 mg/dL (ref 0.76–1.27)
GFR calc Af Amer: 100 mL/min/{1.73_m2} (ref >59)
GFR calc non Af Amer: 87 mL/min/{1.73_m2} (ref >59)
Glucose: 83 mg/dL (ref 65–99)
Potassium: 3.5 mmol/L (ref 3.5–5.2)
Sodium: 139 mmol/L (ref 134–144)

## 2017-10-11 NOTE — Patient Instructions (Signed)
It was great to meet you today! Your blood pressure looks a lot better.  Continue taking metoprolol succinate 25mg  daily and chlorthalidone 25mg  daily. Try taking the chlorthalidone with food.  We will call you with the results of your blood work from today and discuss options for adding another medication at that time.  Follow-up in clinic in 2-3 weeks.

## 2017-10-12 MED ORDER — LISINOPRIL 5 MG PO TABS: 5.0000 mg | ORAL_TABLET | Freq: Every day | ORAL | 3 refills | Status: DC

## 2017-10-15 ENCOUNTER — Telehealth: Payer: Self-pay

## 2017-10-15 MED ORDER — POTASSIUM CHLORIDE CRYS ER 20 MEQ PO TBCR: 20.0000 meq | EXTENDED_RELEASE_TABLET | Freq: Every day | ORAL | 1 refills | Status: DC

## 2017-10-15 NOTE — Telephone Encounter (Signed)
-----   Message from Sueanne Margarita, MD sent at 10/14/2017  8:46 PM EST ----- K+ borderline low  Add Kdur 58meq daily and repeat BMET in 1 week

## 2017-10-15 NOTE — Telephone Encounter (Signed)
Patient made aware of results. Patient instructed to start taking KCL 20 daily and patient is scheduled for BMET on 11/21. Patient verbalizes understanding and thanked me for the call.

## 2017-10-24 ENCOUNTER — Other Ambulatory Visit: Payer: BLUE CROSS/BLUE SHIELD | Admitting: *Deleted

## 2017-10-24 DIAGNOSIS — I10 Essential (primary) hypertension: Secondary | ICD-10-CM

## 2017-10-24 LAB — BASIC METABOLIC PANEL
BUN/Creatinine Ratio: 9 — ABNORMAL LOW (ref 10–24)
BUN: 8 mg/dL (ref 8–27)
CO2: 25 mmol/L (ref 20–29)
Calcium: 9.3 mg/dL (ref 8.6–10.2)
Chloride: 99 mmol/L (ref 96–106)
Creatinine, Ser: 0.85 mg/dL (ref 0.76–1.27)
GFR calc Af Amer: 103 mL/min/{1.73_m2} (ref >59)
GFR calc non Af Amer: 89 mL/min/{1.73_m2} (ref >59)
Glucose: 100 mg/dL — ABNORMAL HIGH (ref 65–99)
Potassium: 3.6 mmol/L (ref 3.5–5.2)
Sodium: 140 mmol/L (ref 134–144)

## 2017-10-29 ENCOUNTER — Telehealth: Payer: Self-pay | Admitting: Cardiology

## 2017-10-29 NOTE — Progress Notes (Signed)
Patient ID: Steven Dorsey                 DOB: 12/23/1945                      MRN: [id number]     HPI: Steven Dorsey is a 71 y.o. male referred by Dr. Radford Dorsey to HTN clinic. PMH is significant for PVCs and HTN. Pt had been well-controlled on metoprolol alone but BP measurements in clinic have been >150s, so Dr. Radford Dorsey added chlorthalidone 25mg  daily at [place] 09/10/17. Patient stated that his BP was improved since adding chlorthalidone, but that he had some nausea. Instructed to take chlorthalidone with food last visit. BP remained elevated at 142/88, so lisinopril 5mg  daily was added on 10/11/17. BMET from 10/11/17 showed borderline low potassium (3.70mmol/L), so KDur 26mEq daily was added. Follow up BMET 10/24/17 showed potassium 3.26mmol/L and pt reported adherence to [place].  Pt presents to clinic today in good spirits. Pt states that he has been taking chlorthalidone with food and has noticed improvement in nausea. Denies headaches, dizziness, lightheadedness, or falls. Pt reports no issues with lisinopril. States that he has been sleeping better since switching to second shift and has much less stress at work.  Current HTN meds: chlorthalidone 25mg  daily, lisinopril 5mg  daily, metoprolol succinate 25mg  daily  Previously tried: n/a  BP goal: <130/80 mmHg  Family History: Stroke in his sister.  Social History: Smokes ~10 cigars/week, reports 3-4 beers/day on the weekends. Recently switched to working second shift.  Diet:  Breakfast - cereal or nabs, Lunch - sandwich, Dinner - packs sandwich or microwave meal (encouraged pt to look at nutrition labels on his meals). Pt has been snacking on chips more lately. Pt adds salt to food but is trying to cut down.  Drinks one-two 12oz cups of coffee, which is a decrease for him since switching to second shift. Drinks water throughout the day.  Exercise: active at work, yardwork  Home BP readings: 133/87 this morning when he checked it, Avg  130s/85  Wt Readings from Last 3 Encounters:  09/10/17 179 lb 6.4 oz (81.4 kg)  09/06/16 168 lb 12.8 oz (76.6 kg)  07/14/15 159 lb (72.1 kg)   BP Readings from Last 3 Encounters:  10/11/17 (!) 142/88  09/10/17 (!) 154/96  09/06/16 (!) 158/88   Pulse Readings from Last 3 Encounters:  10/11/17 71  09/10/17 76  09/06/16 82    Renal function: CrCl cannot be calculated (Unknown ideal weight.).  Past Medical History:  Diagnosis Date  . Hx of cardiovascular stress test    Nuclear (06/27/07):  Diaphragmatic attenuation versus very small area of inferior ischemia, EF 64%, normal wall motion  . Hx of echocardiogram    Echo (06/27/07):  EF 55%, mild aortic sclerosis  . Hypertension   . PVC's (premature ventricular contractions)     Current Outpatient Medications on File Prior to Visit  Medication Sig Dispense Refill  . chlorthalidone (HYGROTON) 25 MG tablet Take 1 tablet (25 mg total) by mouth daily. 30 tablet 11  . Cyanocobalamin (B-12) 2500 MCG TABS Take 2,500 mcg by mouth daily.    Marland Kitchen lisinopril (PRINIVIL,ZESTRIL) 5 MG tablet Take 1 tablet (5 mg total) daily by mouth. 30 tablet 3  . metoprolol succinate (TOPROL-XL) 25 MG 24 hr tablet TAKE ONE TABLET BY MOUTH ONCE DAILY 90 tablet 3  . Multiple Vitamin (MULTIVITAMIN) tablet Take 1 tablet by mouth daily.    Marland Kitchen  potassium chloride SA (K-DUR,KLOR-CON) 20 MEQ tablet Take 1 tablet (20 mEq total) daily by mouth. 30 tablet 1   No current facility-administered medications on file prior to visit.     No Known Allergies   Assessment/Plan:  1. Hypertension - Patient's BP is 142/88 today in clinic, which is above his goal of < 130/80 mmHg. Reinforced decreasing salt intake and congratulated the patient on decreasing his coffee intake. Will increase lisinopril to 10mg  daily. Continue chlorthalidone and metoprolol. Repeat BMET and follow up in HTN clinic in 3 weeks.  Patient seen with Levonne Lapping, PharmD Candidate

## 2017-10-29 NOTE — Telephone Encounter (Signed)
New message  ° ° ° ° °Patient returning call for lab results  °

## 2017-10-29 NOTE — Telephone Encounter (Signed)
Patient confirmed he is taking KDur 20 meq once a day. Will forward to Dr. Radford Pax for recommendations

## 2017-10-30 ENCOUNTER — Ambulatory Visit (INDEPENDENT_AMBULATORY_CARE_PROVIDER_SITE_OTHER): Payer: BLUE CROSS/BLUE SHIELD | Admitting: Pharmacist

## 2017-10-30 VITALS — BP 142/84 | HR 88

## 2017-10-30 DIAGNOSIS — I10 Essential (primary) hypertension: Secondary | ICD-10-CM

## 2017-10-30 NOTE — Patient Instructions (Addendum)
It was great to meet you today!  1. Increase to lisinopril 10mg  daily. You can take 2 of your 5mg  tablets until you run out, then we will send in a new prescription to your pharmacy.  2. Continue taking your chlorthalidone and metoprolol  3. Follow up in clinic for labs and blood pressure check in 3 weeks.  4. Call [phone] if you have any questions or concerns

## 2017-11-01 ENCOUNTER — Ambulatory Visit: Payer: BLUE CROSS/BLUE SHIELD

## 2017-11-05 ENCOUNTER — Telehealth: Payer: Self-pay

## 2017-11-05 MED ORDER — POTASSIUM CHLORIDE CRYS ER 20 MEQ PO TBCR: 20.0000 meq | EXTENDED_RELEASE_TABLET | Freq: Two times a day (BID) | ORAL | 3 refills | Status: DC

## 2017-11-05 MED ORDER — LISINOPRIL 10 MG PO TABS
10.0000 mg | ORAL_TABLET | Freq: Every day | ORAL | 11 refills | Status: DC
Start: 2017-11-05 — End: 2018-09-10

## 2017-11-05 NOTE — Telephone Encounter (Signed)
Notes recorded by Sueanne Margarita, MD on 10/29/2017 at 6:56 PM EST Increase Kdur to 48meq BID and repeat BMET in 1 week   Notes recorded by Teressa Senter, RN on 11/05/2017 at 1:18 PM EST Informed patient to increase Kdur to 44mEq BID. Patient has repeat BMET scheduled for 12/18. Patient is not available to come in at an earlier date. Patient verbalized understanding and thanked me for the call.

## 2017-11-20 ENCOUNTER — Other Ambulatory Visit: Payer: BLUE CROSS/BLUE SHIELD | Admitting: *Deleted

## 2017-11-20 ENCOUNTER — Ambulatory Visit (INDEPENDENT_AMBULATORY_CARE_PROVIDER_SITE_OTHER): Payer: BLUE CROSS/BLUE SHIELD | Admitting: Pharmacist

## 2017-11-20 VITALS — BP 122/78

## 2017-11-20 DIAGNOSIS — I10 Essential (primary) hypertension: Secondary | ICD-10-CM

## 2017-11-20 NOTE — Patient Instructions (Addendum)
Continue all medications as prescribed.   We will call you with the results of your blood work.   Your blood pressure goal is less than 130/80.

## 2017-11-20 NOTE — Progress Notes (Signed)
Patient ID: Steven Dorsey                 DOB: 15-Nov-1946                      MRN: [id number]     HPI: Steven Dorsey is a 71 y.o. male patient of Dr.Turner who presents today for hypertension follow up.  PMH is significant for PVCs and HTN. Pt had been well-controlled on metoprolol alone but BP measurements in clinic have been >150s, so Dr. Radford Pax added chlorthalidone25mg  daily at [facility] 09/10/17. Patient stated that his BP was improved since adding chlorthalidone, but that he had some nausea. He was instructed to take his medication with food to help with nausea. BP remained elevated at 142/88, so lisinopril 5mg  daily was added on 10/11/17. BMET from 10/11/17 showed borderline low potassium (3.64mmol/L), so KDur 67mEq daily was added. Follow up BMET 10/24/17 showed potassium 3.72mmol/L and pt reported adherence to [place]. At his most recent visit in HTN clinic his lisinopril was increased to 10mg  daily.   He is a pleasant gentleman that presents today for follow up. He reports he is feeling well. He denies dizziness, chest pain and SOB.   Current HTN meds:chlorthalidone 25mg  daily, lisinopril 10mg  daily, metoprolol succinate 25mg  daily  Previously tried:n/a BP goal:<130/80 mmHg  Family History: Stroke in his sister.  Social History:Smokes ~10 cigars/week,reports 3-4 beers/day on the weekends. Recently switched to working second shift.  Diet: Breakfast - cereal or nabs, Lunch - sandwich, Dinner - packs sandwich or microwave meal (encouraged pt to look at nutrition labels on his meals). Pt has been snacking on chips more lately. Pt adds salt to food but is trying to cut down.  Drinks one-two 12oz cups of coffee, which is a decrease for him since switching to second shift. Drinks water throughout the day.  Exercise:active at work, yardwork  Home BP readings: 115-126/76-83 HR 69-74  Wt Readings from Last 3 Encounters:  09/10/17 179 lb 6.4 oz (81.4 kg)  09/06/16 168 lb 12.8 oz  (76.6 kg)  07/14/15 159 lb (72.1 kg)   BP Readings from Last 3 Encounters:  11/20/17 122/78  10/30/17 (!) 142/84  10/11/17 (!) 142/88   Pulse Readings from Last 3 Encounters:  10/30/17 88  10/11/17 71  09/10/17 76    Renal function: CrCl cannot be calculated (Unknown ideal weight.).  Past Medical History:  Diagnosis Date  . Hx of cardiovascular stress test    Nuclear (06/27/07):  Diaphragmatic attenuation versus very small area of inferior ischemia, EF 64%, normal wall motion  . Hx of echocardiogram    Echo (06/27/07):  EF 55%, mild aortic sclerosis  . Hypertension   . PVC's (premature ventricular contractions)     Current Outpatient Medications on File Prior to Visit  Medication Sig Dispense Refill  . chlorthalidone (HYGROTON) 25 MG tablet Take 1 tablet (25 mg total) by mouth daily. 30 tablet 11  . Cyanocobalamin (B-12) 2500 MCG TABS Take 2,500 mcg by mouth daily.    Marland Kitchen lisinopril (PRINIVIL,ZESTRIL) 10 MG tablet Take 1 tablet (10 mg total) by mouth daily. 30 tablet 11  . metoprolol succinate (TOPROL-XL) 25 MG 24 hr tablet TAKE ONE TABLET BY MOUTH ONCE DAILY 90 tablet 3  . Multiple Vitamin (MULTIVITAMIN) tablet Take 1 tablet by mouth daily.    . potassium chloride SA (K-DUR,KLOR-CON) 20 MEQ tablet Take 1 tablet (20 mEq total) by mouth 2 (two) times daily. 90 tablet  3   No current facility-administered medications on file prior to visit.     No Known Allergies  Blood pressure 122/78.   Assessment/Plan: Hypertension: BMET today returned WNL. BP at goal on current regimen. He will continue to monitor and call with any concerns or increases in BP. Follow up with Dr. Radford Pax as scheduled and HTN clinic as needed.    Thank you, Lelan Pons. Patterson Hammersmith, [facility]  11/21/2017 7:20 AM

## 2017-11-21 ENCOUNTER — Encounter: Payer: Self-pay | Admitting: Pharmacist

## 2017-11-21 LAB — BASIC METABOLIC PANEL
BUN/Creatinine Ratio: 13 (ref 10–24)
BUN: 11 mg/dL (ref 8–27)
CO2: 25 mmol/L (ref 20–29)
Calcium: 9.8 mg/dL (ref 8.6–10.2)
Chloride: 100 mmol/L (ref 96–106)
Creatinine, Ser: 0.84 mg/dL (ref 0.76–1.27)
GFR calc Af Amer: 103 mL/min/{1.73_m2} (ref >59)
GFR calc non Af Amer: 89 mL/min/{1.73_m2} (ref >59)
Glucose: 97 mg/dL (ref 65–99)
Potassium: 4 mmol/L (ref 3.5–5.2)
Sodium: 140 mmol/L (ref 134–144)

## 2018-09-02 ENCOUNTER — Other Ambulatory Visit: Payer: Self-pay | Admitting: Cardiology

## 2018-09-02 MED ORDER — CHLORTHALIDONE 25 MG PO TABS: 25.0000 mg | ORAL_TABLET | Freq: Every day | ORAL | 0 refills | Status: DC

## 2018-09-02 NOTE — Telephone Encounter (Signed)
New message    *STAT* If patient is at the pharmacy, call can be transferred to refill team.   1. Which medications need to be refilled? (please list name of each medication and dose if known)chlorthalidone (HYGROTON) 25 MG tablet(Expired)  2. Which pharmacy/location (including street and city if local pharmacy) is medication to be sent to?[place], [place] - [address]  3. Do they need a 30 day or 90 day supply? [place]

## 2018-09-02 NOTE — Telephone Encounter (Signed)
Pt's medication was sent to pt's pharmacy as requested. Confirmation received.  °

## 2018-09-06 ENCOUNTER — Other Ambulatory Visit: Payer: Self-pay | Admitting: Cardiology

## 2018-09-09 NOTE — Progress Notes (Signed)
Cardiology Office Note:    Date:  09/10/2018   ID:  Steven Dorsey, DOB 03/04/1946, MRN [id number]  PCP:  Patient, No Pcp Per  Cardiologist:  No primary care provider on file.    Referring MD: No ref. provider found   Chief Complaint  Patient presents with  . Follow-up    PVCs and HTN    History of Present Illness:    Steven Dorsey is a 72 y.o. male with a hx of PVCs and HTN.  He is here today for followup and is doing well.  He denies any chest pain or pressure, SOB, DOE, PND, orthopnea, LE edema, dizziness, palpitations or syncope. He is compliant with his meds and is tolerating meds with no SE.    Past Medical History:  Diagnosis Date  . Hx of cardiovascular stress test    Nuclear (06/27/07):  Diaphragmatic attenuation versus very small area of inferior ischemia, EF 64%, normal wall motion  . Hx of echocardiogram    Echo (06/27/07):  EF 55%, mild aortic sclerosis  . Hypertension   . PVC's (premature ventricular contractions)     Past Surgical History:  Procedure Laterality Date  . MOUTH SURGERY     Had a tooth cut out    Current Medications: Current Meds  Medication Sig  . chlorthalidone (HYGROTON) 25 MG tablet Take 1 tablet (25 mg total) by mouth daily.  . Cyanocobalamin (B-12) 2500 MCG TABS Take 2,500 mcg by mouth daily.  Marland Kitchen lisinopril (PRINIVIL,ZESTRIL) 10 MG tablet Take 1 tablet (10 mg total) by mouth daily.  . metoprolol succinate (TOPROL-XL) 25 MG 24 hr tablet Take 1 tablet (25 mg total) by mouth daily.  . Multiple Vitamin (MULTIVITAMIN) tablet Take 1 tablet by mouth daily.  . potassium chloride SA (K-DUR,KLOR-CON) 20 MEQ tablet Take 20 mEq by mouth daily.  . [DISCONTINUED] chlorthalidone (HYGROTON) 25 MG tablet Take 1 tablet (25 mg total) by mouth daily. Please keep upcoming appt in October for future refills. Thank you  . [DISCONTINUED] lisinopril (PRINIVIL,ZESTRIL) 10 MG tablet Take 1 tablet (10 mg total) by mouth daily.  . [DISCONTINUED] metoprolol  succinate (TOPROL-XL) 25 MG 24 hr tablet TAKE ONE TABLET BY MOUTH ONCE DAILY     Allergies:   Patient has no known allergies.   Social History   Socioeconomic History  . Marital status: Married    Spouse name: Not on file  . Number of children: Not on file  . Years of education: Not on file  . Highest education level: Not on file  Occupational History  . Not on file  Social Needs  . Financial resource strain: Not on file  . Food insecurity:    Worry: Not on file    Inability: Not on file  . Transportation needs:    Medical: Not on file    Non-medical: Not on file  Tobacco Use  . Smoking status: Current Some Day Smoker    Types: Cigars  . Smokeless tobacco: Never Used  Substance and Sexual Activity  . Alcohol use: Not on file  . Drug use: Not on file  . Sexual activity: Not on file  Lifestyle  . Physical activity:    Days per week: Not on file    Minutes per session: Not on file  . Stress: Not on file  Relationships  . Social connections:    Talks on phone: Not on file    Gets together: Not on file    Attends religious service: Not  on file    Active member of club or organization: Not on file    Attends meetings of clubs or organizations: Not on file    Relationship status: Not on file  Other Topics Concern  . Not on file  Social History Narrative  . Not on file     Family History: The patient's family history includes Other in his father and mother; Stroke in his sister.  ROS:   Please see the history of present illness.    ROS  All other systems reviewed and negative.   EKGs/Labs/Other Studies Reviewed:    The following studies were reviewed today: none  EKG:  EKG is ordered today.  The ekg ordered today demonstrates NSr at 71bpm with no ST changes  Recent Labs: 11/20/2017: BUN 11; Creatinine, Ser 0.84; Potassium 4.0; Sodium 140   Recent Lipid Panel No results found for: CHOL, TRIG, HDL, CHOLHDL, VLDL, LDLCALC, LDLDIRECT  Physical Exam:    VS:   BP 112/80   Pulse 71   Ht 5' 10.5" (1.791 m)   Wt 174 lb 6.4 oz (79.1 kg)   SpO2 97%   BMI 24.67 kg/m     Wt Readings from Last 3 Encounters:  09/10/18 174 lb 6.4 oz (79.1 kg)  09/10/17 179 lb 6.4 oz (81.4 kg)  09/06/16 168 lb 12.8 oz (76.6 kg)     GEN:  Well nourished, well developed in no acute distress HEENT: Normal NECK: No JVD; No carotid bruits LYMPHATICS: No lymphadenopathy CARDIAC: RRR, no murmurs, rubs, gallops RESPIRATORY:  Clear to auscultation without rales, wheezing or rhonchi  ABDOMEN: Soft, non-tender, non-distended MUSCULOSKELETAL:  No edema; No deformity  SKIN: Warm and dry NEUROLOGIC:  Alert and oriented x 3 PSYCHIATRIC:  Normal affect   ASSESSMENT:    1. Essential hypertension   2. PVC's (premature ventricular contractions)    PLAN:    In order of problems listed above:  1.  HTN -his BP is well controlled on exam today.  He will continue on Toprol XL 25 mg daily and lisinopril 10 mg daily.  He will also continue on chlorthalidone 25 mg daily.  I will check a BMET.  2.  PVCs - these are well suppressed on beta-blocker therapy.  He will continue on Toprol XL 25 mg daily.   Medication Adjustments/Labs and Tests Ordered: Current medicines are reviewed at length with the patient today.  Concerns regarding medicines are outlined above.  Orders Placed This Encounter  Procedures  . EKG 12-Lead   Meds ordered this encounter  Medications  . chlorthalidone (HYGROTON) 25 MG tablet    Sig: Take 1 tablet (25 mg total) by mouth daily.    Dispense:  90 tablet    Refill:  3  . lisinopril (PRINIVIL,ZESTRIL) 10 MG tablet    Sig: Take 1 tablet (10 mg total) by mouth daily.    Dispense:  90 tablet    Refill:  3  . metoprolol succinate (TOPROL-XL) 25 MG 24 hr tablet    Sig: Take 1 tablet (25 mg total) by mouth daily.    Dispense:  90 tablet    Refill:  3    Signed, Fransico Him, MD  09/10/2018 10:27 AM    [facility]

## 2018-09-10 ENCOUNTER — Encounter: Payer: Self-pay | Admitting: Cardiology

## 2018-09-10 ENCOUNTER — Encounter (INDEPENDENT_AMBULATORY_CARE_PROVIDER_SITE_OTHER): Payer: Self-pay

## 2018-09-10 ENCOUNTER — Ambulatory Visit: Payer: BLUE CROSS/BLUE SHIELD | Admitting: Cardiology

## 2018-09-10 VITALS — BP 112/80 | HR 71 | Ht 70.5 in | Wt 174.4 lb

## 2018-09-10 DIAGNOSIS — I493 Ventricular premature depolarization: Secondary | ICD-10-CM

## 2018-09-10 DIAGNOSIS — I10 Essential (primary) hypertension: Secondary | ICD-10-CM | POA: Diagnosis not present

## 2018-09-10 LAB — BASIC METABOLIC PANEL
BUN/Creatinine Ratio: 12 (ref 10–24)
BUN: 14 mg/dL (ref 8–27)
CO2: 23 mmol/L (ref 20–29)
Calcium: 9.6 mg/dL (ref 8.6–10.2)
Chloride: 100 mmol/L (ref 96–106)
Creatinine, Ser: 1.18 mg/dL (ref 0.76–1.27)
GFR calc Af Amer: 72 mL/min/{1.73_m2} (ref >59)
GFR calc non Af Amer: 62 mL/min/{1.73_m2} (ref >59)
Glucose: 87 mg/dL (ref 65–99)
Potassium: 4.4 mmol/L (ref 3.5–5.2)
Sodium: 141 mmol/L (ref 134–144)

## 2018-09-10 MED ORDER — METOPROLOL SUCCINATE ER 25 MG PO TB24: 25.0000 mg | ORAL_TABLET | Freq: Every day | ORAL | 3 refills | Status: DC

## 2018-09-10 MED ORDER — CHLORTHALIDONE 25 MG PO TABS: 25.0000 mg | ORAL_TABLET | Freq: Every day | ORAL | 3 refills | Status: DC

## 2018-09-10 MED ORDER — LISINOPRIL 10 MG PO TABS: 10.0000 mg | ORAL_TABLET | Freq: Every day | ORAL | 3 refills | Status: DC

## 2018-09-10 NOTE — Patient Instructions (Signed)
Medication Instructions:  Your physician recommends that you continue on your current medications as directed. Please refer to the Current Medication list given to you today.  If you need a refill on your cardiac medications before your next appointment, please call your pharmacy.   Lab work: Today: BMET If you have labs (blood work) drawn today and your tests are completely normal, you will receive your results only by: . MyChart Message (if you have MyChart) OR . A paper copy in the mail If you have any lab test that is abnormal or we need to change your treatment, we will call you to review the results.  Follow-Up: At [facility], you and your health needs are our priority.  As part of our continuing mission to provide you with exceptional heart care, we have created designated Provider Care Teams.  These Care Teams include your primary Cardiologist (physician) and Advanced Practice Providers (APPs -  Physician Assistants and Nurse Practitioners) who all work together to provide you with the care you need, when you need it. You will need a follow up appointment in 1 years.  Please call our office 2 months in advance to schedule this appointment.  You may see Dr. Turner or one of the following Advanced Practice Providers on your designated Care Team:   Brittainy Simmons, PA-C Dayna Dunn, PA-C . Michele Lenze, PA-C   

## 2018-11-06 ENCOUNTER — Other Ambulatory Visit: Payer: Self-pay | Admitting: Cardiology

## 2018-11-06 MED ORDER — POTASSIUM CHLORIDE CRYS ER 20 MEQ PO TBCR: 20.0000 meq | EXTENDED_RELEASE_TABLET | Freq: Every day | ORAL | 3 refills | Status: DC

## 2019-09-01 ENCOUNTER — Other Ambulatory Visit: Payer: Self-pay | Admitting: Cardiology

## 2019-09-01 NOTE — Telephone Encounter (Signed)
°*  STAT* If patient is at the pharmacy, call can be transferred to refill team.   1. Which medications need to be refilled? (please list name of each medication and dose if known)  Metoprolol, Lisinopril and  Chlorthalidone  2. Which pharmacy/location (including street and city if local pharmacy) is medication to be sent to? [place], [place]  3. Do they need a 30 day or 90 day supply? Enough until his office visit on 10-14-19

## 2019-09-22 DIAGNOSIS — Z20828 Contact with and (suspected) exposure to other viral communicable diseases: Secondary | ICD-10-CM | POA: Diagnosis not present

## 2019-09-22 DIAGNOSIS — Z1159 Encounter for screening for other viral diseases: Secondary | ICD-10-CM | POA: Diagnosis not present

## 2019-10-13 ENCOUNTER — Telehealth: Payer: Self-pay | Admitting: *Deleted

## 2019-10-13 NOTE — Telephone Encounter (Signed)
Virtual Visit Pre-Appointment Phone Call  "(Name), I am calling you today to discuss your upcoming appointment. We are currently trying to limit exposure to the virus that causes COVID-19 by seeing patients at home rather than in the office."  1. "What is the BEST phone number to call the day of the visit?" - include this in appointment [phone]  2. "Do you have or have access to (through a family member/friend) a smartphone with video capability that we can use for your visit?"  a. If yes - list this number in appt notes as "cell" (if different from BEST phone #) and list the appointment type as a VIDEO visit in appointment notes b. If no - list the appointment type as a PHONE visit in appointment notes  3. Confirm consent - "In the setting of the current Covid19 crisis, you are scheduled for a phone visit with your provider on 10/14/19.  Just as we do with many in-office visits, in order for you to participate in this visit, we must obtain consent.  If you'd like, I can send this to your mychart (if signed up) or email for you to review.  Otherwise, I can obtain your verbal consent now.  All virtual visits are billed to your insurance company just like a normal visit would be.  By agreeing to a virtual visit, we'd like you to understand that the technology does not allow for your provider to perform an examination, and thus may limit your provider's ability to fully assess your condition. If your provider identifies any concerns that need to be evaluated in person, we will make arrangements to do so.  Finally, though the technology is pretty good, we cannot assure that it will always work on either your or our end, and in the setting of a video visit, we may have to convert it to a phone-only visit.  In either situation, we cannot ensure that we have a secure connection.  Are you willing to proceed?" STAFF: Did the patient verbally acknowledge consent to telehealth visit? Document YES/NO  here: yes  4. Advise patient to be prepared - "Two hours prior to your appointment, go ahead and check your blood pressure, pulse, oxygen saturation, and your weight (if you have the equipment to check those) and write them all down. When your visit starts, your provider will ask you for this information. If you have an Apple Watch or Kardia device, please plan to have heart rate information ready on the day of your appointment. Please have a pen and paper handy nearby the day of the visit as well."  5. Give patient instructions for MyChart download to smartphone OR Doximity/Doxy.me as below if video visit (depending on what platform provider is using)  6. Inform patient they will receive a phone call 15 minutes prior to their appointment time (may be from unknown caller ID) so they should be prepared to answer    TELEPHONE CALL NOTE  Steven Dorsey has been deemed a candidate for a follow-up tele-health visit to limit community exposure during the Covid-19 pandemic. I spoke with the patient via phone to ensure availability of phone/video source, confirm preferred email & phone number, and discuss instructions and expectations.  I reminded Steven Dorsey to be prepared with any vital sign and/or heart rhythm information that could potentially be obtained via home monitoring, at the time of his visit. I reminded Steven Dorsey to expect a phone call prior to his visit.  Leodis Liverpool, RN 10/13/2019 4:17 PM   INSTRUCTIONS FOR DOWNLOADING THE MYCHART APP TO SMARTPHONE  - The patient must first make sure to have activated MyChart and know their login information - If Apple, go to [employer] and type in MyChart in the search bar and download the app. If Android, ask patient to go to [employer] and type in [place] in the search bar and download the app. The app is free but as with any other app downloads, their phone may require them to verify saved payment information or  Apple/Android password.  - The patient will need to then log into the app with their MyChart username and password, and select [facility] as their healthcare provider to link the account. When it is time for your visit, go to the MyChart app, find appointments, and click Begin Video Visit. Be sure to Select Allow for your device to access the Microphone and Camera for your visit. You will then be connected, and your provider will be with you shortly.  **If they have any issues connecting, or need assistance please contact MyChart service desk [phone])**  **If using a computer, in order to ensure the best quality for their visit they will need to use either of the following Internet Browsers: [employer], or Google Chrome**  IF USING DOXIMITY or DOXY.ME - The patient will receive a link just prior to their visit by text.     FULL LENGTH CONSENT FOR TELE-HEALTH VISIT   I hereby voluntarily request, consent and authorize [facility] and its employed or contracted physicians, physician assistants, nurse practitioners or other licensed health care professionals (the Practitioner), to provide me with telemedicine health care services (the "Services") as deemed necessary by the treating Practitioner. I acknowledge and consent to receive the Services by the Practitioner via telemedicine. I understand that the telemedicine visit will involve communicating with the Practitioner through live audiovisual communication technology and the disclosure of certain medical information by electronic transmission. I acknowledge that I have been given the opportunity to request an in-person assessment or other available alternative prior to the telemedicine visit and am voluntarily participating in the telemedicine visit.  I understand that I have the right to withhold or withdraw my consent to the use of telemedicine in the course of my care at any time, without affecting my right to future care  or treatment, and that the Practitioner or I may terminate the telemedicine visit at any time. I understand that I have the right to inspect all information obtained and/or recorded in the course of the telemedicine visit and may receive copies of available information for a reasonable fee.  I understand that some of the potential risks of receiving the Services via telemedicine include:  Marland Kitchen Delay or interruption in medical evaluation due to technological equipment failure or disruption; . Information transmitted may not be sufficient (e.g. poor resolution of images) to allow for appropriate medical decision making by the Practitioner; and/or  . In rare instances, security protocols could fail, causing a breach of personal health information.  Furthermore, I acknowledge that it is my responsibility to provide information about my medical history, conditions and care that is complete and accurate to the best of my ability. I acknowledge that Practitioner's advice, recommendations, and/or decision may be based on factors not within their control, such as incomplete or inaccurate data provided by me or distortions of diagnostic images or specimens that may result from electronic transmissions. I understand that the  practice of medicine is not an Chief Strategy Officer and that Practitioner makes no warranties or guarantees regarding treatment outcomes. I acknowledge that I will receive a copy of this consent concurrently upon execution via email to the email address I last provided but may also request a printed copy by calling the office of [facility].    I understand that my insurance will be billed for this visit.   I have read or had this consent read to me. . I understand the contents of this consent, which adequately explains the benefits and risks of the Services being provided via telemedicine.  . I have been provided ample opportunity to ask questions regarding this consent and the Services and have had  my questions answered to my satisfaction. . I give my informed consent for the services to be provided through the use of telemedicine in my medical care  By participating in this telemedicine visit I agree to the above.

## 2019-10-13 NOTE — Telephone Encounter (Signed)
I placed call to pt to obtain consent for telemedicine visit. Left message to call office.

## 2019-10-13 NOTE — Progress Notes (Signed)
Virtual Visit via Telephone Note   This visit type was conducted due to national recommendations for restrictions regarding the COVID-19 Pandemic (e.g. social distancing) in an effort to limit this patient's exposure and mitigate transmission in our community.  Due to his co-morbid illnesses, this patient is at least at moderate risk for complications without adequate follow up.  This format is felt to be most appropriate for this patient at this time.  All issues noted in this document were discussed and addressed.  A limited physical exam was performed with this format.  Please refer to the patient's chart for his consent to telehealth for [facility].   Evaluation Performed:  Follow-up visit  This visit type was conducted due to national recommendations for restrictions regarding the COVID-19 Pandemic (e.g. social distancing).  This format is felt to be most appropriate for this patient at this time.  All issues noted in this document were discussed and addressed.  No physical exam was performed (except for noted visual exam findings with Video Visits).  Please refer to the patient's chart (MyChart message for video visits and phone note for telephone visits) for the patient's consent to telehealth for [facility].  Date:  10/14/2019   ID:  Steven Dorsey, DOB 06/09/1946, MRN [id number]  Patient Location:  Home  Provider location:   [facility]  PCP:  Patient, No Pcp Per  Cardiologist:  Fransico Him, MD  Electrophysiologist:  None   Chief Complaint:  PVCs and HTN  History of Present Illness:    Steven Dorsey is a 73 y.o. male who presents via audio/video conferencing for a telehealth visit today.   Steven Dorsey is a 73 y.o. male with a hx of PVCs and HTN.  He is here today for followup and is doing well.  He denies any chest pain or pressure, SOB, DOE, PND, orthopnea, LE edema, dizziness, palpitations or syncope. He is compliant with his meds and is tolerating  meds with no SE.    The patient does not have symptoms concerning for COVID-19 infection (fever, chills, cough, or new shortness of breath).   Prior CV studies:   The following studies were reviewed today:  none  Past Medical History:  Diagnosis Date  . Hx of echocardiogram    Echo (06/27/07):  EF 55%, mild aortic sclerosis  . PVC's (premature ventricular contractions)    Past Surgical History:  Procedure Laterality Date  . MOUTH SURGERY     Had a tooth cut out     Current Meds  Medication Sig  . chlorthalidone (HYGROTON) 25 MG tablet Take 1 tablet (25 mg total) by mouth daily. Pt needs to keep upcoming appt in nov for further refills  . Cyanocobalamin (B-12) 2500 MCG TABS Take 2,500 mcg by mouth daily.  Marland Kitchen lisinopril (PRINIVIL,ZESTRIL) 10 MG tablet Take 1 tablet (10 mg total) by mouth daily.  . metoprolol succinate (TOPROL-XL) 25 MG 24 hr tablet Take 1 tablet (25 mg total) by mouth daily. Pt needs to keep upcoming appt in Nov for further refills  . Multiple Vitamin (MULTIVITAMIN) tablet Take 1 tablet by mouth daily.     Allergies:   Patient has no known allergies.   Social History   Tobacco Use  . Smoking status: Current Some Day Smoker    Types: Cigars  . Smokeless tobacco: Never Used  Substance Use Topics  . Alcohol use: Not on file  . Drug use: Not on file     Family Hx:  The patient's family history includes Other in his father and mother; Stroke in his sister.  ROS:   Please see the history of present illness.     All other systems reviewed and are negative.   Labs/Other Tests and Data Reviewed:    Recent Labs: No results found for requested labs within last 8760 hours.   Recent Lipid Panel No results found for: CHOL, TRIG, HDL, CHOLHDL, LDLCALC, LDLDIRECT  Wt Readings from Last 3 Encounters:  10/14/19 167 lb (75.8 kg)  09/10/18 174 lb 6.4 oz (79.1 kg)  09/10/17 179 lb 6.4 oz (81.4 kg)     Objective:    Vital Signs:  BP 126/81   Pulse 70   Temp  98 F (36.7 C)   Ht 5\' 10"  (1.778 m)   Wt 167 lb (75.8 kg)   BMI 23.96 kg/m     ASSESSMENT & PLAN:    1.  HTN -BP controlled on exam -continue Toprol XL 25mg  daily, Lisinopril 10mg  daily and chlorthalidone 25mg  daily. -check BMET  2.  PVCs -denies any palpitations -continue suppression with Toprol XL 25mg  daily  COVID-19 Education: The signs and symptoms of COVID-19 were discussed with the patient and how to seek care for testing (follow up with PCP or arrange E-visit).  The importance of social distancing was discussed today.  Patient Risk:   After full review of this patient's clinical status, I feel that they are at least moderate risk at this time.  Time:   Today, I have spent 20 minutes directly with the patient on telemedicine discussing medical problems including HTN and PVCs.  We also reviewed the symptoms of COVID 19 and the ways to protect against contracting the virus with telehealth technology.  I spent an additional 5 minutes reviewing patient's chart including labs.  Medication Adjustments/Labs and Tests Ordered: Current medicines are reviewed at length with the patient today.  Concerns regarding medicines are outlined above.  Tests Ordered: No orders of the defined types were placed in this encounter.  Medication Changes: No orders of the defined types were placed in this encounter.   Disposition:  Follow up in 1 year(s)  Signed, Fransico Him, MD  10/14/2019 8:53 AM    [facility]

## 2019-10-14 ENCOUNTER — Telehealth (INDEPENDENT_AMBULATORY_CARE_PROVIDER_SITE_OTHER): Payer: Self-pay | Admitting: Cardiology

## 2019-10-14 ENCOUNTER — Encounter: Payer: Self-pay | Admitting: Cardiology

## 2019-10-14 ENCOUNTER — Other Ambulatory Visit: Payer: Self-pay

## 2019-10-14 VITALS — BP 126/81 | HR 70 | Temp 98.0°F | Ht 70.0 in | Wt 167.0 lb

## 2019-10-14 DIAGNOSIS — I493 Ventricular premature depolarization: Secondary | ICD-10-CM

## 2019-10-14 DIAGNOSIS — I10 Essential (primary) hypertension: Secondary | ICD-10-CM

## 2019-10-14 MED ORDER — METOPROLOL SUCCINATE ER 25 MG PO TB24: 25.0000 mg | ORAL_TABLET | Freq: Every day | ORAL | 3 refills | Status: DC

## 2019-10-14 MED ORDER — LISINOPRIL 10 MG PO TABS: 10.0000 mg | ORAL_TABLET | Freq: Every day | ORAL | 3 refills | Status: DC

## 2019-10-14 MED ORDER — CHLORTHALIDONE 25 MG PO TABS: 25.0000 mg | ORAL_TABLET | Freq: Every day | ORAL | 3 refills | Status: DC

## 2019-10-14 NOTE — Patient Instructions (Addendum)
Medication Instructions:  Your physician recommends that you continue on your current medications as directed. Please refer to the Current Medication list given to you today.  *If you need a refill on your cardiac medications before your next appointment, please call your pharmacy*  Lab Work: Your physician recommends that you return for lab work on November 18,2020 at 1:30--BMET  If you have labs (blood work) drawn today and your tests are completely normal, you will receive your results only by: Marland Kitchen MyChart Message (if you have MyChart) OR . A paper copy in the mail If you have any lab test that is abnormal or we need to change your treatment, we will call you to review the results.  Testing/Procedures: none  Follow-Up: At [facility], you and your health needs are our priority.  As part of our continuing mission to provide you with exceptional heart care, we have created designated Provider Care Teams.  These Care Teams include your primary Cardiologist (physician) and Advanced Practice Providers (APPs -  Physician Assistants and Nurse Practitioners) who all work together to provide you with the care you need, when you need it.  Your next appointment:   12 months  The format for your next appointment:   In Person  Provider:   Fransico Him, MD  Other Instructions

## 2019-10-14 NOTE — Addendum Note (Signed)
Addended by: Thompson Grayer on: 10/14/2019 09:17 AM   Modules accepted: Orders

## 2019-10-22 ENCOUNTER — Other Ambulatory Visit: Payer: Self-pay

## 2019-10-22 ENCOUNTER — Other Ambulatory Visit: Payer: BLUE CROSS/BLUE SHIELD | Admitting: *Deleted

## 2019-10-22 DIAGNOSIS — I10 Essential (primary) hypertension: Secondary | ICD-10-CM | POA: Diagnosis not present

## 2019-10-22 DIAGNOSIS — I493 Ventricular premature depolarization: Secondary | ICD-10-CM

## 2019-10-23 LAB — BASIC METABOLIC PANEL
BUN/Creatinine Ratio: 10 (ref 10–24)
BUN: 9 mg/dL (ref 8–27)
CO2: 24 mmol/L (ref 20–29)
Calcium: 9.4 mg/dL (ref 8.6–10.2)
Chloride: 100 mmol/L (ref 96–106)
Creatinine, Ser: 0.93 mg/dL (ref 0.76–1.27)
GFR calc Af Amer: 95 mL/min/{1.73_m2} (ref >59)
GFR calc non Af Amer: 82 mL/min/{1.73_m2} (ref >59)
Glucose: 98 mg/dL (ref 65–99)
Potassium: 4.4 mmol/L (ref 3.5–5.2)
Sodium: 140 mmol/L (ref 134–144)

## 2020-09-27 NOTE — Progress Notes (Signed)
Virtual Visit via Telephone Note   This visit type was conducted due to national recommendations for restrictions regarding the COVID-19 Pandemic (e.g. social distancing) in an effort to limit this patient's exposure and mitigate transmission in our community.  Due to his co-morbid illnesses, this patient is at least at moderate risk for complications without adequate follow up.  This format is felt to be most appropriate for this patient at this time.  The patient did not have access to video technology/had technical difficulties with video requiring transitioning to audio format only (telephone).  All issues noted in this document were discussed and addressed.  No physical exam could be performed with this format.  Please refer to the patient's chart for his  consent to telehealth for [facility].    Date:  09/29/2020   ID:  Steven Dorsey, DOB March 20, 1946, MRN [id number] The patient was identified using 2 identifiers.  Patient Location: Home Provider Location: Office/Clinic  PCP:  Patient, No Pcp Per  Cardiologist:  Fransico Him, MD   Electrophysiologist:  None   Evaluation Performed:  Follow-Up Visit  Chief Complaint: Follow up  History of Present Illness:    Steven Dorsey is a 74 y.o. male with a hx of PVCs and HTN.  Last had a telemedicine visit with Dr. Radford Pax and was doing well.  Patient says he's doing well. 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 No regular exercise outside of work. Does his own yard work. Hasn't been checking his BP regularly. Rechecked his BP this am 123/79 P 68. No recent palpitations, chest pain, dyspnea. No recent blood work. No PCP.   The patient does not have symptoms concerning for COVID-19 infection (fever, chills, cough, or new shortness of breath).    Past Medical History:  Diagnosis Date  . Hx of echocardiogram    Echo (06/27/07):  EF 55%, mild aortic sclerosis  . PVC's  (premature ventricular contractions)    Past Surgical History:  Procedure Laterality Date  . MOUTH SURGERY     Had a tooth cut out     Current Meds  Medication Sig  . chlorthalidone (HYGROTON) 25 MG tablet Take 1 tablet (25 mg total) by mouth daily.  . Cyanocobalamin (B-12) 2500 MCG TABS Take 2,500 mcg by mouth daily.  Marland Kitchen lisinopril (ZESTRIL) 10 MG tablet Take 1 tablet (10 mg total) by mouth daily.  . metoprolol succinate (TOPROL-XL) 25 MG 24 hr tablet Take 1 tablet (25 mg total) by mouth daily.  . Multiple Vitamin (MULTIVITAMIN) tablet Take 1 tablet by mouth daily.  . [DISCONTINUED] chlorthalidone (HYGROTON) 25 MG tablet Take 1 tablet (25 mg total) by mouth daily.  . [DISCONTINUED] lisinopril (ZESTRIL) 10 MG tablet Take 1 tablet (10 mg total) by mouth daily.  . [DISCONTINUED] metoprolol succinate (TOPROL-XL) 25 MG 24 hr tablet Take 1 tablet (25 mg total) by mouth daily.     Allergies:   Patient has no known allergies.   Social History   Tobacco Use  . Smoking status: Former Smoker    Types: Cigars    Quit date: 07/04/2020    Years since quitting: 0.2  . Smokeless tobacco: Never Used  Vaping Use  . Vaping Use: Never used  Substance Use Topics  . Alcohol use: Yes    Alcohol/week: 8.0 standard drinks    Types: 8 Cans of beer per week  . Drug use: Not Currently     Family Hx: The patient's family  history includes Other in his father and mother; Stroke in his sister.  ROS:   Please see the history of present illness.      All other systems reviewed and are negative.   Prior CV studies:   The following studies were reviewed today:  2D echo 2008 normal LV size and function mild aortic valve sclerosis no stenosis  Labs/Other Tests and Data Reviewed:    EKG:  No ECG reviewed.  Recent Labs: 10/22/2019: BUN 9; Creatinine, Ser 0.93; Potassium 4.4; Sodium 140   Recent Lipid Panel No results found for: CHOL, TRIG, HDL, CHOLHDL, LDLCALC, LDLDIRECT  Wt Readings from Last  3 Encounters:  09/29/20 173 lb (78.5 kg)  10/14/19 167 lb (75.8 kg)  09/10/18 174 lb 6.4 oz (79.1 kg)     Risk Assessment/Calculations:      Objective:    Vital Signs:  BP 131/89   Pulse 78   Temp 98.6 F (37 C)   Ht 5\' 10"  (1.778 m)   Wt 173 lb (78.5 kg)   BMI 24.82 kg/m    VITAL SIGNS:  reviewed  ASSESSMENT & PLAN:    1. Hypertension on Toprol, lisinopril and chlorthalidone-well controlled, still working full time. Will check surveillence labs and f/u in 1 yr. Recommend he get a PCP 2. PVCs on Toprol-no palpitations.  COVID-19 Education: The signs and symptoms of COVID-19 were discussed with the patient and how to seek care for testing (follow up with PCP or arrange E-visit).  The importance of social distancing was discussed today.  Time:   Today, I have spent 9:10 minutes with the patient with telehealth technology discussing the above problems.     Medication Adjustments/Labs and Tests Ordered: Current medicines are reviewed at length with the patient today.  Concerns regarding medicines are outlined above.   Tests Ordered: Orders Placed This Encounter  Procedures  . Comprehensive metabolic panel  . CBC  . Lipid panel  . TSH    Medication Changes: Meds ordered this encounter  Medications  . chlorthalidone (HYGROTON) 25 MG tablet    Sig: Take 1 tablet (25 mg total) by mouth daily.    Dispense:  90 tablet    Refill:  3  . lisinopril (ZESTRIL) 10 MG tablet    Sig: Take 1 tablet (10 mg total) by mouth daily.    Dispense:  90 tablet    Refill:  3  . metoprolol succinate (TOPROL-XL) 25 MG 24 hr tablet    Sig: Take 1 tablet (25 mg total) by mouth daily.    Dispense:  90 tablet    Refill:  3    Follow Up:  In Person or virtual in 1 year(s) Dr. Radford Pax  Signed, Ermalinda Barrios, PA-C  09/29/2020 11:26 AM    [place]

## 2020-09-29 ENCOUNTER — Telehealth: Payer: Self-pay

## 2020-09-29 ENCOUNTER — Other Ambulatory Visit: Payer: Self-pay

## 2020-09-29 ENCOUNTER — Telehealth (INDEPENDENT_AMBULATORY_CARE_PROVIDER_SITE_OTHER): Payer: BLUE CROSS/BLUE SHIELD | Admitting: Physician Assistant

## 2020-09-29 ENCOUNTER — Encounter: Payer: Self-pay | Admitting: Physician Assistant

## 2020-09-29 VITALS — BP 131/89 | HR 78 | Temp 98.6°F | Ht 70.0 in | Wt 173.0 lb

## 2020-09-29 DIAGNOSIS — I493 Ventricular premature depolarization: Secondary | ICD-10-CM

## 2020-09-29 DIAGNOSIS — I10 Essential (primary) hypertension: Secondary | ICD-10-CM

## 2020-09-29 DIAGNOSIS — Z79899 Other long term (current) drug therapy: Secondary | ICD-10-CM | POA: Diagnosis not present

## 2020-09-29 MED ORDER — METOPROLOL SUCCINATE ER 25 MG PO TB24: 25.0000 mg | ORAL_TABLET | Freq: Every day | ORAL | 3 refills | Status: DC

## 2020-09-29 MED ORDER — LISINOPRIL 10 MG PO TABS: 10.0000 mg | ORAL_TABLET | Freq: Every day | ORAL | 3 refills | Status: DC

## 2020-09-29 MED ORDER — CHLORTHALIDONE 25 MG PO TABS: 25.0000 mg | ORAL_TABLET | Freq: Every day | ORAL | 3 refills | Status: DC

## 2020-09-29 NOTE — Telephone Encounter (Signed)
LM with pt stating that the labs that were ordered today he can have done at [facility].

## 2020-09-29 NOTE — Patient Instructions (Signed)
Medication Instructions:  Your physician recommends that you continue on your current medications as directed. Please refer to the Current Medication list given to you today.  *If you need a refill on your cardiac medications before your next appointment, please call your pharmacy*   Lab Work: CMET, CBC, FLP, TSH  If you have labs (blood work) drawn today and your tests are completely normal, you will receive your results only by: Marland Kitchen MyChart Message (if you have MyChart) OR . A paper copy in the mail If you have any lab test that is abnormal or we need to change your treatment, we will call you to review the results.   Follow-Up: At [facility], you and your health needs are our priority.  As part of our continuing mission to provide you with exceptional heart care, we have created designated Provider Care Teams.  These Care Teams include your primary Cardiologist (physician) and Advanced Practice Providers (APPs -  Physician Assistants and Nurse Practitioners) who all work together to provide you with the care you need, when you need it.  We recommend signing up for the patient portal called "MyChart".  Sign up information is provided on this After Visit Summary.  MyChart is used to connect with patients for Virtual Visits (Telemedicine).  Patients are able to view lab/test results, encounter notes, upcoming appointments, etc.  Non-urgent messages can be sent to your provider as well.   To learn more about what you can do with MyChart, go to [email or web address].    Your next appointment:   1 year(s)  The format for your next appointment:   In Person  Provider:   You may see Fransico Him, MD or one of the following Advanced Practice Providers on your designated Care Team:    Melina Copa, PA-C  Ermalinda Barrios, PA-C

## 2020-10-26 ENCOUNTER — Telehealth: Payer: BLUE CROSS/BLUE SHIELD | Admitting: Physician Assistant

## 2020-11-23 ENCOUNTER — Other Ambulatory Visit: Payer: Self-pay | Admitting: Cardiology

## 2021-11-07 ENCOUNTER — Telehealth: Payer: Self-pay | Admitting: Physician Assistant

## 2021-11-07 ENCOUNTER — Other Ambulatory Visit: Payer: Self-pay

## 2021-11-07 MED ORDER — LISINOPRIL 10 MG PO TABS: 10.0000 mg | ORAL_TABLET | Freq: Every day | ORAL | 0 refills | Status: DC

## 2021-11-07 MED ORDER — METOPROLOL SUCCINATE ER 25 MG PO TB24
25.0000 mg | ORAL_TABLET | Freq: Every day | ORAL | 0 refills | Status: DC
Start: 2021-11-07 — End: 2021-12-12

## 2021-11-07 MED ORDER — CHLORTHALIDONE 25 MG PO TABS
25.0000 mg | ORAL_TABLET | Freq: Every day | ORAL | 0 refills | Status: DC
Start: 2021-11-07 — End: 2021-12-12

## 2021-11-07 NOTE — Telephone Encounter (Signed)
Patient would like to know if his appointment 02/08/2022 with Richardson Dopp can be virtual. He says his last appointment was virtual and he does not live in [place].

## 2021-11-07 NOTE — Telephone Encounter (Signed)
Pt's medication was sent to pt's pharmacy as requested. Confirmation received.  °

## 2021-11-08 NOTE — Telephone Encounter (Signed)
Pt has not been seen in person in the office since 2019.  He should be seen. It looks like he lives in [place].  If it's easier to be seen there by one of our providers, we can arrange that.  We would have to clear it with the MD in [place] and Dr. Radford Pax here. Richardson Dopp, PA-C    11/08/2021 4:26 PM

## 2021-11-09 NOTE — Telephone Encounter (Signed)
S/w pt about upcoming appt iin march.  Pt needs to be seen in office with EKG since last appt in office was 2019.  Offered pt to switch office's to a closer facility in [place].  Pt stated will keep upcoming appt in March in the [place] office.

## 2021-12-12 ENCOUNTER — Other Ambulatory Visit: Payer: Self-pay

## 2021-12-12 MED ORDER — LISINOPRIL 10 MG PO TABS: 10.0000 mg | ORAL_TABLET | Freq: Every day | ORAL | 1 refills | Status: DC

## 2021-12-12 MED ORDER — CHLORTHALIDONE 25 MG PO TABS: 25.0000 mg | ORAL_TABLET | Freq: Every day | ORAL | 1 refills | Status: DC

## 2021-12-12 MED ORDER — METOPROLOL SUCCINATE ER 25 MG PO TB24: 25.0000 mg | ORAL_TABLET | Freq: Every day | ORAL | 1 refills | Status: DC

## 2021-12-12 NOTE — Telephone Encounter (Signed)
Pt's medications was sent to pt's pharmacy as requested. Confirmation received.  

## 2022-02-07 NOTE — Progress Notes (Signed)
? ? ?Office Visit  ?  ?Patient Name: Steven Dorsey ?Date of Encounter: 02/07/2022 ? ?Primary Care Provider:  Patient, No Pcp Per (Inactive) ?Primary Cardiologist:  Fransico Him, MD ? ?Chief Complaint  ?  ? Follow up (last seen 2021) ? ?History of Present Illness  ?  ?Steven Dorsey is a 76 y.o. male with PMH of PVCs and HTN.  He was last seen by Dr. Radford Pax in 10/19.  Echo completed 2008 with an EF 55%and mild aortic sclerosis.  Exercise treadmill stress test completed 2008 with small reversible defect in the apex with questionable diaphragmatic attenuation.  Hypertension uncontrolled at times treated with lisinopril, Toprol-XL, chlorthalidone.  PVCs are well suppressed on metoprolol XL.  ? ?Patient presents today for follow up.  He was last seen by Ermalinda Barrios, PA by virtual visit.  He lives in [place] and refers to continue to see Dr. Radford Pax at this location.  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  He states that his daughter watches his sodium intake and he does not salt his food.  He is compliant with getting plenty to drink daily and denies any problems with dehydration. Since last being seen in our clinic the he reports doing well.He denies chest pain, palpitations, dyspnea, PND, orthopnea, nausea, vomiting, dizziness, syncope, edema, weight gain, or early satiety.   ? ? ? ?Past Medical History  ?  ?Past Medical History:  ?Diagnosis Date  ? Hx of echocardiogram   ? Echo (06/27/07):  EF 55%, mild aortic sclerosis  ? PVC's (premature ventricular contractions)   ? ?Past Surgical History:  ?Procedure Laterality Date  ? MOUTH SURGERY    ? Had a tooth cut out  ? ? ?Allergies ? ?No Known Allergies ? ?Home Medications  ?  ?Current Outpatient Medications  ?Medication Sig Dispense Refill  ? chlorthalidone (HYGROTON) 25 MG tablet Take 1 tablet (25 mg total) by mouth daily. Please keep upcoming appt in March 2023 with Cardiologist before anymore refills.  Thank you Final Attempt 30 tablet 1  ? Cyanocobalamin (B-12) 2500 MCG TABS Take 2,500 mcg by mouth daily.    ? lisinopril (ZESTRIL) 10 MG tablet Take 1 tablet (10 mg total) by mouth daily. 30 tablet 1  ? metoprolol succinate (TOPROL-XL) 25 MG 24 hr tablet Take 1 tablet (25 mg total) by mouth daily. 30 tablet 1  ? Multiple Vitamin (MULTIVITAMIN) tablet Take 1 tablet by mouth daily.    ? ?No current facility-administered medications for this visit.  ?  ? ?Review of Systems  ? ?General:  No chills, fever, night sweats or weight changes.  ?Cardiovascular:  No chest pain, dyspnea on exertion, edema, orthopnea, palpitations, paroxysmal nocturnal dyspnea. ?Dermatological: No rash, lesions/masses ?Respiratory: No cough, dyspnea ?Urologic: No hematuria, dysuria ?Abdominal:   No nausea, vomiting, diarrhea, bright red blood per rectum, melena, or hematemesis ?Neurologic:  No visual changes, wkns, changes in mental status. ?All other systems reviewed and are otherwise negative except as noted above. ? ? ? ?Physical Exam  ?  ?Wt Readings from Last 3 Encounters:  ?09/29/20 173 lb (78.5 kg)  ?10/14/19 167 lb (75.8 kg)  ?09/10/18 174 lb 6.4 oz (79.1 kg)  ? ? ?UX:LKGMW were no vitals filed for this visit.  ? ?GEN: Well nourished, well developed, in no acute distress. ?Neck: Supple, no JVD, carotid bruits, or masses. ?Cardiac: RRR, no murmurs, rubs, or gallops. No clubbing, cyanosis, edema.  Radials/PT  2+ and equal bilaterally.  ?Respiratory:  Respirations regular and unlabored, clear to auscultation bilaterally. ?MS: no deformity or atrophy. ?Skin: warm and dry, no rash. ?Neuro:  Strength and sensation are intact. ?Psych: Normal affect. ? ?Accessory Clinical Findings  ?  ?ECG personally reviewed by me today -normal sinus rhythm rate of 71- no acute changes. ? ?Risk Assessment/Calculations:   ?  ? ?    ? ? ?Lab Results  ?Component Value Date  ? WBC 8.7 05/18/2007  ? HGB 14.8 05/18/2007  ? HCT 42.0 05/18/2007  ? MCV 92.2 05/18/2007  ?  PLT 272 05/18/2007  ? ?Lab Results  ?Component Value Date  ? CREATININE 0.93 10/22/2019  ? BUN 9 10/22/2019  ? NA 140 10/22/2019  ? K 4.4 10/22/2019  ? CL 100 10/22/2019  ? CO2 24 10/22/2019  ? ?No results found for: ALT, AST, GGT, ALKPHOS, BILITOT ?No results found for: CHOL, HDL, LDLCALC, LDLDIRECT, TRIG, CHOLHDL  ?No results found for: HGBA1C ? ?Assessment & Plan  ?  ?1. Hypertension: ?-He was last seen via virtual visit in 2021.  He was encouraged to maintain yearly visits with the office.  He was provided referral for PCP and also dermatologist. ?-Blood pressure well controlled today at 122/70 ?-BMET today ?-Continue low-sodium heart healthy diet ?-Exercise encouraged for min 150 minutes per week ?-Continue lisinopril 10 mg ?-Continue chlorthalidone 25 mg ?-Continue Toprol-XL 25 mg ? ?2.  PVC's: ?- Last echo 2008 with EF 55%  and mild aortic sclerosis.   ?-He reports no PVCs with current medication regimen ?-Continue Toprol-XL 25 mg ?-Continue lisinopril 10 mg ? ? ?Disposition: Follow-up with Dr. Radford Pax  in 1 year ? ?   ? ?Medication Adjustments/Labs and Tests Ordered: ?Current medicines are reviewed at length with the patient today.  Concerns regarding medicines are outlined above.  ?Tests Ordered: ?No orders of the defined types were placed in this encounter. ? ?Medication Changes: ?No orders of the defined types were placed in this encounter. ? ? ?Steven Dorsey, Marissa Nestle, NP ?02/07/2022, 5:13 PM ?    ?

## 2022-02-08 ENCOUNTER — Encounter: Payer: Self-pay | Admitting: Nurse Practitioner

## 2022-02-08 ENCOUNTER — Ambulatory Visit: Payer: BC Managed Care – PPO | Admitting: Nurse Practitioner

## 2022-02-08 ENCOUNTER — Other Ambulatory Visit: Payer: Self-pay

## 2022-02-08 VITALS — BP 122/70 | HR 71 | Ht 70.0 in | Wt 174.8 lb

## 2022-02-08 DIAGNOSIS — I10 Essential (primary) hypertension: Secondary | ICD-10-CM

## 2022-02-08 DIAGNOSIS — I493 Ventricular premature depolarization: Secondary | ICD-10-CM | POA: Diagnosis not present

## 2022-02-08 DIAGNOSIS — C439 Malignant melanoma of skin, unspecified: Secondary | ICD-10-CM | POA: Diagnosis not present

## 2022-02-08 DIAGNOSIS — Z79899 Other long term (current) drug therapy: Secondary | ICD-10-CM | POA: Diagnosis not present

## 2022-02-08 LAB — BASIC METABOLIC PANEL
BUN/Creatinine Ratio: 12 (ref 10–24)
BUN: 15 mg/dL (ref 8–27)
CO2: 25 mmol/L (ref 20–29)
Calcium: 9.3 mg/dL (ref 8.6–10.2)
Chloride: 100 mmol/L (ref 96–106)
Creatinine, Ser: 1.26 mg/dL (ref 0.76–1.27)
Glucose: 103 mg/dL — ABNORMAL HIGH (ref 70–99)
Potassium: 4.8 mmol/L (ref 3.5–5.2)
Sodium: 138 mmol/L (ref 134–144)
eGFR: 60 mL/min/{1.73_m2} (ref >59)

## 2022-02-08 MED ORDER — CHLORTHALIDONE 25 MG PO TABS: 25.0000 mg | ORAL_TABLET | Freq: Every day | ORAL | 3 refills | Status: DC

## 2022-02-08 MED ORDER — LISINOPRIL 10 MG PO TABS: 10.0000 mg | ORAL_TABLET | Freq: Every day | ORAL | 3 refills | Status: DC

## 2022-02-08 MED ORDER — METOPROLOL SUCCINATE ER 25 MG PO TB24: 25.0000 mg | ORAL_TABLET | Freq: Every day | ORAL | 3 refills | Status: DC

## 2022-02-08 NOTE — Patient Instructions (Addendum)
Medication Instructions:   Your physician recommends that you continue on your current medications as directed. Please refer to the Current Medication list given to you today.   *If you need a refill on your cardiac medications before your next appointment, please call your pharmacy*   Lab Work:  TODAY!!! BMET   If you have labs (blood work) drawn today and your tests are completely normal, you will receive your results only by: [place] (if you have MyChart) OR A paper copy in the mail If you have any lab test that is abnormal or we need to change your treatment, we will call you to review the results.   Follow-Up: At [facility], you and your health needs are our priority.  As part of our continuing mission to provide you with exceptional heart care, we have created designated Provider Care Teams.  These Care Teams include your primary Cardiologist (physician) and Advanced Practice Providers (APPs -  Physician Assistants and Nurse Practitioners) who all work together to provide you with the care you need, when you need it.  We recommend signing up for the patient portal called "MyChart".  Sign up information is provided on this After Visit Summary.  MyChart is used to connect with patients for Virtual Visits (Telemedicine).  Patients are able to view lab/test results, encounter notes, upcoming appointments, etc.  Non-urgent messages can be sent to your provider as well.   To learn more about what you can do with MyChart, go to [email or web address].    Your next appointment:   1 year(s)  The format for your next appointment:   In Person  Provider:   Fransico Him, MD     Other Instructions  Your physician wants you to follow-up in: 1 year with Dr. Radford Pax. You will receive a reminder letter in the mail two months in advance. If you don't receive a letter, please call our office to schedule the follow-up appointment.  You have been referred to Dermatology. The office  should be calling you to set up appointment.    Low-Sodium Eating Plan Sodium, which is an element that makes up salt, helps you maintain a healthy balance of fluids in your body. Too much sodium can increase your blood pressure and cause fluid and waste to be held in your body. Your health care provider or dietitian may recommend following this plan if you have high blood pressure (hypertension), kidney disease, liver disease, or heart failure. Eating less sodium can help lower your blood pressure, reduce swelling, and protect your heart, liver, and kidneys. What are tips for following this plan? Reading food labels The Nutrition Facts label lists the amount of sodium in one serving of the food. If you eat more than one serving, you must multiply the listed amount of sodium by the number of servings. Choose foods with less than 140 mg of sodium per serving. Avoid foods with 300 mg of sodium or more per serving. Shopping  Look for lower-sodium products, often labeled as "low-sodium" or "no salt added." Always check the sodium content, even if foods are labeled as "unsalted" or "no salt added." Buy fresh foods. Avoid canned foods and pre-made or frozen meals. Avoid canned, cured, or processed meats. Buy breads that have less than 80 mg of sodium per slice. Cooking  Eat more home-cooked food and less restaurant, buffet, and fast food. Avoid adding salt when cooking. Use salt-free seasonings or herbs instead of table salt or sea salt. Check with your health care  provider or pharmacist before using salt substitutes. Cook with plant-based oils, such as canola, sunflower, or olive oil. Meal planning When eating at a restaurant, ask that your food be prepared with less salt or no salt, if possible. Avoid dishes labeled as brined, pickled, cured, smoked, or made with soy sauce, miso, or teriyaki sauce. Avoid foods that contain MSG (monosodium glutamate). MSG is sometimes added to [place] food,  bouillon, and some canned foods. Make meals that can be grilled, baked, poached, roasted, or steamed. These are generally made with less sodium. General information Most people on this plan should limit their sodium intake to 1,500-2,000 mg (milligrams) of sodium each day. What foods should I eat? Fruits Fresh, frozen, or canned fruit. Fruit juice. Vegetables Fresh or frozen vegetables. "No salt added" canned vegetables. "No salt added" tomato sauce and paste. Low-sodium or reduced-sodium tomato and vegetable juice. Grains Low-sodium cereals, including oats, puffed wheat and rice, and shredded wheat. Low-sodium crackers. Unsalted rice. Unsalted pasta. Low-sodium bread. Whole-grain breads and whole-grain pasta. Meats and other proteins Fresh or frozen (no salt added) meat, poultry, seafood, and fish. Low-sodium canned tuna and salmon. Unsalted nuts. Dried peas, beans, and lentils without added salt. Unsalted canned beans. Eggs. Unsalted nut butters. Dairy Milk. Soy milk. Cheese that is naturally low in sodium, such as ricotta cheese, fresh mozzarella, or Swiss cheese. Low-sodium or reduced-sodium cheese. Cream cheese. Yogurt. Seasonings and condiments Fresh and dried herbs and spices. Salt-free seasonings. Low-sodium mustard and ketchup. Sodium-free salad dressing. Sodium-free light mayonnaise. Fresh or refrigerated horseradish. Lemon juice. Vinegar. Other foods Homemade, reduced-sodium, or low-sodium soups. Unsalted popcorn and pretzels. Low-salt or salt-free chips. The items listed above may not be a complete list of foods and beverages you can eat. Contact a dietitian for more information. What foods should I avoid? Vegetables Sauerkraut, pickled vegetables, and relishes. Olives. [place] fries. Onion rings. Regular canned vegetables (not low-sodium or reduced-sodium). Regular canned tomato sauce and paste (not low-sodium or reduced-sodium). Regular tomato and vegetable juice (not low-sodium or  reduced-sodium). Frozen vegetables in sauces. Grains Instant hot cereals. Bread stuffing, pancake, and biscuit mixes. Croutons. Seasoned rice or pasta mixes. Noodle soup cups. Boxed or frozen macaroni and cheese. Regular salted crackers. Self-rising flour. Meats and other proteins Meat or fish that is salted, canned, smoked, spiced, or pickled. Precooked or cured meat, such as sausages or meat loaves. Berniece Salines. Ham. Pepperoni. Hot dogs. Corned beef. Chipped beef. Salt pork. Jerky. Pickled herring. Anchovies and sardines. Regular canned tuna. Salted nuts. Dairy Processed cheese and cheese spreads. Hard cheeses. Cheese curds. Blue cheese. Feta cheese. String cheese. Regular cottage cheese. Buttermilk. Canned milk. Fats and oils Salted butter. Regular margarine. Ghee. Bacon fat. Seasonings and condiments Onion salt, garlic salt, seasoned salt, table salt, and sea salt. Canned and packaged gravies. Worcestershire sauce. Tartar sauce. Barbecue sauce. Teriyaki sauce. Soy sauce, including reduced-sodium. Steak sauce. Fish sauce. Oyster sauce. Cocktail sauce. Horseradish that you find on the shelf. Regular ketchup and mustard. Meat flavorings and tenderizers. Bouillon cubes. Hot sauce. Pre-made or packaged marinades. Pre-made or packaged taco seasonings. Relishes. Regular salad dressings. Salsa. Other foods Salted popcorn and pretzels. Corn chips and puffs. Potato and tortilla chips. Canned or dried soups. Pizza. Frozen entrees and pot pies. The items listed above may not be a complete list of foods and beverages you should avoid. Contact a dietitian for more information. Summary Eating less sodium can help lower your blood pressure, reduce swelling, and protect your heart, liver, and kidneys. Most  people on this plan should limit their sodium intake to 1,500-2,000 mg (milligrams) of sodium each day. Canned, boxed, and frozen foods are high in sodium. Restaurant foods, fast foods, and pizza are also very high  in sodium. You also get sodium by adding salt to food. Try to cook at home, eat more fresh fruits and vegetables, and eat less fast food and canned, processed, or prepared foods. This information is not intended to replace advice given to you by your health care provider. Make sure you discuss any questions you have with your health care provider. Document Revised: 12/26/2019 Document Reviewed: 10/22/2019 Elsevier Patient Education  2022 [place] Many factors influence your heart (coronary) health, including eating and exercise habits. Coronary risk increases with abnormal blood fat (lipid) levels. Heart-healthy meal planning includes limiting unhealthy fats, increasing healthy fats, and making other diet and lifestyle changes. What is my plan? Your health care provider may recommend that you: Limit your fat intake to _________% or less of your total calories each day. Limit your saturated fat intake to _________% or less of your total calories each day. Limit the amount of cholesterol in your diet to less than _________ mg per day. What are tips for following this plan? Cooking Cook foods using methods other than frying. Baking, boiling, grilling, and broiling are all good options. Other ways to reduce fat include: Removing the skin from poultry. Removing all visible fats from meats. Steaming vegetables in water or broth. Meal planning  At meals, imagine dividing your plate into fourths: Fill one-half of your plate with vegetables and green salads. Fill one-fourth of your plate with whole grains. Fill one-fourth of your plate with lean protein foods. Eat 4-5 servings of vegetables per day. One serving equals 1 cup raw or cooked vegetable, or 2 cups raw leafy greens. Eat 4-5 servings of fruit per day. One serving equals 1 medium whole fruit,  cup dried fruit,  cup fresh, frozen, or canned fruit, or  cup 100% fruit juice. Eat more foods that contain soluble  fiber. Examples include apples, broccoli, carrots, beans, peas, and barley. Aim to get 25-30 g of fiber per day. Increase your consumption of legumes, nuts, and seeds to 4-5 servings per week. One serving of dried beans or legumes equals  cup cooked, 1 serving of nuts is  cup, and 1 serving of seeds equals 1 tablespoon. Fats Choose healthy fats more often. Choose monounsaturated and polyunsaturated fats, such as olive and canola oils, flaxseeds, walnuts, almonds, and seeds. Eat more omega-3 fats. Choose salmon, mackerel, sardines, tuna, flaxseed oil, and ground flaxseeds. Aim to eat fish at least 2 times each week. Check food labels carefully to identify foods with trans fats or high amounts of saturated fat. Limit saturated fats. These are found in animal products, such as meats, butter, and cream. Plant sources of saturated fats include palm oil, palm kernel oil, and coconut oil. Avoid foods with partially hydrogenated oils in them. These contain trans fats. Examples are stick margarine, some tub margarines, cookies, crackers, and other baked goods. Avoid fried foods. General information Eat more home-cooked food and less restaurant, buffet, and fast food. Limit or avoid alcohol. Limit foods that are high in starch and sugar. Lose weight if you are overweight. Losing just 5-10% of your body weight can help your overall health and prevent diseases such as diabetes and heart disease. Monitor your salt (sodium) intake, especially if you have high blood pressure. Talk with your health  care provider about your sodium intake. Try to incorporate more vegetarian meals weekly. What foods can I eat? Fruits All fresh, canned (in natural juice), or frozen fruits. Vegetables Fresh or frozen vegetables (raw, steamed, roasted, or grilled). Green salads. Grains Most grains. Choose whole wheat and whole grains most of the time. Rice and pasta, including brown rice and pastas made with whole wheat. Meats  and other proteins Lean, well-trimmed beef, veal, pork, and lamb. Chicken and [place] without skin. All fish and shellfish. Wild duck, rabbit, pheasant, and venison. Egg whites or low-cholesterol egg substitutes. Dried beans, peas, lentils, and tofu. Seeds and most nuts. Dairy Low-fat or nonfat cheeses, including ricotta and mozzarella. Skim or 1% milk (liquid, powdered, or evaporated). Buttermilk made with low-fat milk. Nonfat or low-fat yogurt. Fats and oils Non-hydrogenated (trans-free) margarines. Vegetable oils, including soybean, sesame, sunflower, olive, peanut, safflower, corn, canola, and cottonseed. Salad dressings or mayonnaise made with a vegetable oil. Beverages Water (mineral or sparkling). Coffee and tea. Diet carbonated beverages. Sweets and desserts Sherbet, gelatin, and fruit ice. Small amounts of dark chocolate. Limit all sweets and desserts. Seasonings and condiments All seasonings and condiments. The items listed above may not be a complete list of foods and beverages you can eat. Contact a dietitian for more options. What foods are not recommended? Fruits Canned fruit in heavy syrup. Fruit in cream or butter sauce. Fried fruit. Limit coconut. Vegetables Vegetables cooked in cheese, cream, or butter sauce. Fried vegetables. Grains Breads made with saturated or trans fats, oils, or whole milk. Croissants. Sweet rolls. Donuts. High-fat crackers, such as cheese crackers. Meats and other proteins Fatty meats, such as hot dogs, ribs, sausage, bacon, rib-eye roast or steak. High-fat deli meats, such as salami and bologna. Caviar. Domestic duck and goose. Organ meats, such as liver. Dairy Cream, sour cream, cream cheese, and creamed cottage cheese. Whole-milk cheeses. Whole or 2% milk (liquid, evaporated, or condensed). Whole buttermilk. Cream sauce or high-fat cheese sauce. Whole-milk yogurt. Fats and oils Meat fat, or shortening. Cocoa butter, hydrogenated oils, palm oil,  coconut oil, palm kernel oil. Solid fats and shortenings, including bacon fat, salt pork, lard, and butter. Nondairy cream substitutes. Salad dressings with cheese or sour cream. Beverages Regular sodas and any drinks with added sugar. Sweets and desserts Frosting. Pudding. Cookies. Cakes. Pies. Milk chocolate or white chocolate. Buttered syrups. Full-fat ice cream or ice cream drinks. The items listed above may not be a complete list of foods and beverages to avoid. Contact a dietitian for more information. Summary Heart-healthy meal planning includes limiting unhealthy fats, increasing healthy fats, and making other diet and lifestyle changes. Lose weight if you are overweight. Losing just 5-10% of your body weight can help your overall health and prevent diseases such as diabetes and heart disease. Focus on eating a balance of foods, including fruits and vegetables, low-fat or nonfat dairy, lean protein, nuts and legumes, whole grains, and heart-healthy oils and fats. This information is not intended to replace advice given to you by your health care provider. Make sure you discuss any questions you have with your health care provider. Document Revised: 03/31/2021 Document Reviewed: 03/31/2021 Elsevier Patient Education  2022 [employer].

## 2022-02-27 DIAGNOSIS — D485 Neoplasm of uncertain behavior of skin: Secondary | ICD-10-CM | POA: Diagnosis not present

## 2022-02-28 DIAGNOSIS — C44712 Basal cell carcinoma of skin of right lower limb, including hip: Secondary | ICD-10-CM | POA: Diagnosis not present

## 2022-03-16 DIAGNOSIS — C44712 Basal cell carcinoma of skin of right lower limb, including hip: Secondary | ICD-10-CM | POA: Diagnosis not present

## 2022-03-16 DIAGNOSIS — D485 Neoplasm of uncertain behavior of skin: Secondary | ICD-10-CM | POA: Diagnosis not present

## 2022-03-16 DIAGNOSIS — C44519 Basal cell carcinoma of skin of other part of trunk: Secondary | ICD-10-CM | POA: Diagnosis not present

## 2022-03-16 DIAGNOSIS — C44619 Basal cell carcinoma of skin of left upper limb, including shoulder: Secondary | ICD-10-CM | POA: Diagnosis not present

## 2022-03-23 DIAGNOSIS — L905 Scar conditions and fibrosis of skin: Secondary | ICD-10-CM | POA: Diagnosis not present

## 2022-04-03 DIAGNOSIS — C44712 Basal cell carcinoma of skin of right lower limb, including hip: Secondary | ICD-10-CM | POA: Diagnosis not present

## 2022-04-11 DIAGNOSIS — C44619 Basal cell carcinoma of skin of left upper limb, including shoulder: Secondary | ICD-10-CM | POA: Diagnosis not present

## 2022-04-11 DIAGNOSIS — D485 Neoplasm of uncertain behavior of skin: Secondary | ICD-10-CM | POA: Diagnosis not present

## 2022-04-11 DIAGNOSIS — L905 Scar conditions and fibrosis of skin: Secondary | ICD-10-CM | POA: Diagnosis not present

## 2022-04-18 DIAGNOSIS — C44619 Basal cell carcinoma of skin of left upper limb, including shoulder: Secondary | ICD-10-CM | POA: Diagnosis not present

## 2022-04-24 DIAGNOSIS — C44519 Basal cell carcinoma of skin of other part of trunk: Secondary | ICD-10-CM | POA: Diagnosis not present

## 2022-05-02 DIAGNOSIS — C44519 Basal cell carcinoma of skin of other part of trunk: Secondary | ICD-10-CM | POA: Diagnosis not present

## 2022-05-04 DIAGNOSIS — K4090 Unilateral inguinal hernia, without obstruction or gangrene, not specified as recurrent: Secondary | ICD-10-CM | POA: Diagnosis not present

## 2022-05-04 DIAGNOSIS — K7689 Other specified diseases of liver: Secondary | ICD-10-CM | POA: Diagnosis not present

## 2022-05-04 DIAGNOSIS — J929 Pleural plaque without asbestos: Secondary | ICD-10-CM | POA: Diagnosis not present

## 2022-05-04 DIAGNOSIS — K5730 Diverticulosis of large intestine without perforation or abscess without bleeding: Secondary | ICD-10-CM | POA: Diagnosis not present

## 2022-05-04 DIAGNOSIS — R918 Other nonspecific abnormal finding of lung field: Secondary | ICD-10-CM | POA: Diagnosis not present

## 2022-05-04 DIAGNOSIS — C4491 Basal cell carcinoma of skin, unspecified: Secondary | ICD-10-CM | POA: Diagnosis not present

## 2022-05-04 DIAGNOSIS — I700 Atherosclerosis of aorta: Secondary | ICD-10-CM | POA: Diagnosis not present

## 2022-05-09 DIAGNOSIS — C44519 Basal cell carcinoma of skin of other part of trunk: Secondary | ICD-10-CM | POA: Diagnosis not present

## 2022-06-07 DIAGNOSIS — C44619 Basal cell carcinoma of skin of left upper limb, including shoulder: Secondary | ICD-10-CM | POA: Diagnosis not present

## 2022-06-12 DIAGNOSIS — L905 Scar conditions and fibrosis of skin: Secondary | ICD-10-CM | POA: Diagnosis not present

## 2022-08-21 DIAGNOSIS — L905 Scar conditions and fibrosis of skin: Secondary | ICD-10-CM | POA: Diagnosis not present

## 2022-09-18 DIAGNOSIS — C44719 Basal cell carcinoma of skin of left lower limb, including hip: Secondary | ICD-10-CM | POA: Diagnosis not present

## 2022-09-18 DIAGNOSIS — C44519 Basal cell carcinoma of skin of other part of trunk: Secondary | ICD-10-CM | POA: Diagnosis not present

## 2022-09-18 DIAGNOSIS — D485 Neoplasm of uncertain behavior of skin: Secondary | ICD-10-CM | POA: Diagnosis not present

## 2022-09-18 DIAGNOSIS — Z129 Encounter for screening for malignant neoplasm, site unspecified: Secondary | ICD-10-CM | POA: Diagnosis not present

## 2022-09-18 DIAGNOSIS — L570 Actinic keratosis: Secondary | ICD-10-CM | POA: Diagnosis not present

## 2022-09-18 DIAGNOSIS — C44712 Basal cell carcinoma of skin of right lower limb, including hip: Secondary | ICD-10-CM | POA: Diagnosis not present

## 2022-09-18 DIAGNOSIS — C44619 Basal cell carcinoma of skin of left upper limb, including shoulder: Secondary | ICD-10-CM | POA: Diagnosis not present

## 2022-09-21 DIAGNOSIS — C44519 Basal cell carcinoma of skin of other part of trunk: Secondary | ICD-10-CM | POA: Diagnosis not present

## 2023-02-05 ENCOUNTER — Other Ambulatory Visit: Payer: Self-pay

## 2023-02-05 MED ORDER — METOPROLOL SUCCINATE ER 25 MG PO TB24: 25.0000 mg | ORAL_TABLET | Freq: Every day | ORAL | 1 refills | Status: DC

## 2023-02-05 MED ORDER — CHLORTHALIDONE 25 MG PO TABS: 25.0000 mg | ORAL_TABLET | Freq: Every day | ORAL | 1 refills | Status: DC

## 2023-02-05 MED ORDER — LISINOPRIL 10 MG PO TABS: 10.0000 mg | ORAL_TABLET | Freq: Every day | ORAL | 1 refills | Status: DC

## 2023-02-05 NOTE — Telephone Encounter (Signed)
Pt's medications were sent to pt's pharmacy as requested. Confirmation received.  

## 2023-05-11 ENCOUNTER — Encounter: Payer: Self-pay | Admitting: Cardiology

## 2023-05-11 ENCOUNTER — Ambulatory Visit: Payer: BC Managed Care – PPO | Attending: Cardiology | Admitting: Cardiology

## 2023-05-11 VITALS — BP 129/90 | HR 84 | Ht 70.0 in | Wt 181.6 lb

## 2023-05-11 DIAGNOSIS — Z79899 Other long term (current) drug therapy: Secondary | ICD-10-CM | POA: Diagnosis not present

## 2023-05-11 DIAGNOSIS — I10 Essential (primary) hypertension: Secondary | ICD-10-CM | POA: Diagnosis not present

## 2023-05-11 DIAGNOSIS — I493 Ventricular premature depolarization: Secondary | ICD-10-CM

## 2023-05-11 NOTE — Patient Instructions (Signed)
Medication Instructions:  Your physician recommends that you continue on your current medications as directed. Please refer to the Current Medication list given to you today.  *If you need a refill on your cardiac medications before your next appointment, please call your pharmacy*   Lab Work: Please complete a BMET in our lab today before you leave.  If you have labs (blood work) drawn today and your tests are completely normal, you will receive your results only by: MyChart Message (if you have MyChart) OR A paper copy in the mail If you have any lab test that is abnormal or we need to change your treatment, we will call you to review the results.   Testing/Procedures: None.   Follow-Up: At [facility], you and your health needs are our priority.  As part of our continuing mission to provide you with exceptional heart care, we have created designated Provider Care Teams.  These Care Teams include your primary Cardiologist (physician) and Advanced Practice Providers (APPs -  Physician Assistants and Nurse Practitioners) who all work together to provide you with the care you need, when you need it.  We recommend signing up for the patient portal called "MyChart".  Sign up information is provided on this After Visit Summary.  MyChart is used to connect with patients for Virtual Visits (Telemedicine).  Patients are able to view lab/test results, encounter notes, upcoming appointments, etc.  Non-urgent messages can be sent to your provider as well.   To learn more about what you can do with MyChart, go to [email or web address].    Your next appointment:   1 year(s)  Provider:   Armanda Magic, MD     Other Instructions Please check your blood pressure at home once day, do this 2-3 hours after taking your blood pressure meds. Write your readings down with the date/time of the reading. Then call our office at [phone] and our operators can get those readings to Dr. Mayford Knife.

## 2023-05-11 NOTE — Progress Notes (Signed)
Date:  05/11/2023   ID:  Steven Dorsey, DOB [birth year], MRN [id number]  PCP:  Patient, No Pcp Per  Cardiologist:  Armanda Magic, MD  Electrophysiologist:  None   Chief Complaint:  PVCs and HTN  History of Present Illness:    Steven Dorsey is a 77 y.o. male  with a hx of PVCs and HTN.  He is here today for followup and is doing well.  He denies any chest pain or pressure, SOB, DOE, PND, orthopnea, LE edema, dizziness, palpitations or syncope. He is compliant with his meds and is tolerating meds with no SE.    Prior CV studies:   The following studies were reviewed today:  EKG   Past Medical History:  Diagnosis Date   Hx of echocardiogram    Echo (06/27/07):  EF 55%, mild aortic sclerosis   PVC's (premature ventricular contractions)    Past Surgical History:  Procedure Laterality Date   MOUTH SURGERY     Had a tooth cut out     Current Meds  Medication Sig   chlorthalidone (HYGROTON) 25 MG tablet Take 1 tablet (25 mg total) by mouth daily.   Cyanocobalamin (B-12) 2500 MCG TABS Take 2,500 mcg by mouth daily.   lisinopril (ZESTRIL) 10 MG tablet Take 1 tablet (10 mg total) by mouth daily.   metoprolol succinate (TOPROL-XL) 25 MG 24 hr tablet Take 1 tablet (25 mg total) by mouth daily.   Multiple Vitamin (MULTIVITAMIN) tablet Take 1 tablet by mouth daily.     Allergies:   Patient has no known allergies.   Social History   Tobacco Use   Smoking status: Former    Types: Cigars    Quit date: 07/04/2020    Years since quitting: 2.8   Smokeless tobacco: Never  Vaping Use   Vaping Use: Never used  Substance Use Topics   Alcohol use: Yes    Alcohol/week: 8.0 standard drinks of alcohol    Types: 8 Cans of beer per week   Drug use: Not Currently     Family Hx: The patient's family history includes Other in his father and mother; Stroke in his sister.  ROS:   Please see the history of present illness.     All other systems reviewed and are  negative.   Labs/Other Tests and Data Reviewed:    Recent Labs: No results found for requested labs within last 365 days.   Recent Lipid Panel No results found for: "CHOL", "TRIG", "HDL", "CHOLHDL", "LDLCALC", "LDLDIRECT"  Wt Readings from Last 3 Encounters:  05/11/23 181 lb 9.6 oz (82.4 kg)  02/08/22 174 lb 12.8 oz (79.3 kg)  09/29/20 173 lb (78.5 kg)     Objective:    Vital Signs:  BP (!) 129/90   Pulse 84   Ht 5\' 10"  (1.778 m)   Wt 181 lb 9.6 oz (82.4 kg)   SpO2 98%   BMI 26.06 kg/m    GEN: Well nourished, well developed in no acute distress HEENT: Normal NECK: No JVD; No carotid bruits LYMPHATICS: No lymphadenopathy CARDIAC:RRR, no murmurs, rubs, gallops RESPIRATORY:  Clear to auscultation without rales, wheezing or rhonchi  ABDOMEN: Soft, non-tender, non-distended MUSCULOSKELETAL:  No edema; No deformity  SKIN: Warm and dry NEUROLOGIC:  Alert and oriented x 3 PSYCHIATRIC:  Normal affect   EKG done today demonstrates NSR with PVCs  and possible anterior infarct age undetermined ASSESSMENT & PLAN:    1.  HTN -BP is adequately controlled on exam -  Continue prescription drug management Toprol-XL 25 mg daily, lisinopril 10 mg daily and chlorthalidone 25 mg daily with as needed refills -Check  bmet today  2.  PVCs -He has not had any palpitations since I saw him last -Continue drug management Toprol-XL 25 mg daily with as needed refills  Medication Adjustments/Labs and Tests Ordered: Current medicines are reviewed at length with the patient today.  Concerns regarding medicines are outlined above.  Tests Ordered: Orders Placed This Encounter  Procedures   EKG 12-Lead   Medication Changes: No orders of the defined types were placed in this encounter.   Disposition:  Follow up in 1 year(s)  Signed, Armanda Magic, MD  05/11/2023 3:00 PM    [facility]

## 2023-05-11 NOTE — Addendum Note (Signed)
Addended by: Luellen Pucker on: 05/11/2023 03:28 PM   Modules accepted: Orders

## 2023-05-12 LAB — BASIC METABOLIC PANEL
BUN/Creatinine Ratio: 9 — ABNORMAL LOW (ref 10–24)
BUN: 12 mg/dL (ref 8–27)
CO2: 24 mmol/L (ref 20–29)
Calcium: 9.9 mg/dL (ref 8.6–10.2)
Chloride: 100 mmol/L (ref 96–106)
Creatinine, Ser: 1.31 mg/dL — ABNORMAL HIGH (ref 0.76–1.27)
Glucose: 113 mg/dL — ABNORMAL HIGH (ref 70–99)
Potassium: 4.1 mmol/L (ref 3.5–5.2)
Sodium: 137 mmol/L (ref 134–144)
eGFR: 57 mL/min/{1.73_m2} — ABNORMAL LOW (ref >59)

## 2023-05-14 ENCOUNTER — Telehealth: Payer: Self-pay

## 2023-05-14 ENCOUNTER — Other Ambulatory Visit: Payer: Self-pay

## 2023-05-14 DIAGNOSIS — Z79899 Other long term (current) drug therapy: Secondary | ICD-10-CM

## 2023-05-14 NOTE — Telephone Encounter (Signed)
-----   Message from Quintella Reichert, MD sent at 05/14/2023  9:43 AM EDT ----- SCr has increased - decrease Chlorthalidone to 12.5mg  daily and check BMET in 1 week.  CHeck BP daily for a week and call with results

## 2023-05-14 NOTE — Telephone Encounter (Signed)
Pt instructed to decrease Chlorthalidone to 12.5 mg, come in next week for BMET and check BP daily and to send those readings into [place]. Pt stated understanding.

## 2023-05-23 ENCOUNTER — Ambulatory Visit: Payer: BC Managed Care – PPO | Attending: Cardiology

## 2023-05-23 DIAGNOSIS — Z79899 Other long term (current) drug therapy: Secondary | ICD-10-CM | POA: Diagnosis not present

## 2023-05-24 ENCOUNTER — Telehealth: Payer: Self-pay

## 2023-05-24 DIAGNOSIS — Z79899 Other long term (current) drug therapy: Secondary | ICD-10-CM

## 2023-05-24 LAB — BASIC METABOLIC PANEL
BUN/Creatinine Ratio: 8 — ABNORMAL LOW (ref 10–24)
BUN: 13 mg/dL (ref 8–27)
CO2: 23 mmol/L (ref 20–29)
Calcium: 9.3 mg/dL (ref 8.6–10.2)
Chloride: 101 mmol/L (ref 96–106)
Creatinine, Ser: 1.64 mg/dL — ABNORMAL HIGH (ref 0.76–1.27)
Glucose: 127 mg/dL — ABNORMAL HIGH (ref 70–99)
Potassium: 4 mmol/L (ref 3.5–5.2)
Sodium: 139 mmol/L (ref 134–144)
eGFR: 44 mL/min/{1.73_m2} — ABNORMAL LOW (ref >59)

## 2023-05-24 MED ORDER — METOPROLOL SUCCINATE ER 50 MG PO TB24: 50.0000 mg | ORAL_TABLET | Freq: Every day | ORAL | 3 refills | Status: DC

## 2023-05-24 NOTE — Telephone Encounter (Signed)
-----   Message from Quintella Reichert, MD sent at 05/24/2023  7:48 AM EDT ----- SCr bumped with Chlorthalidone - stop chlorthalidone and increase Toprol XL to 50mg  daily. Check BMET in 1 week.  Check BP and HR daily for a week and call with results

## 2023-05-24 NOTE — Telephone Encounter (Signed)
Call to patient to advise of increased Cr on recent BMET. Advised to d/c chlorthalidone and increase toprol to 50 mg daily. Patient verbalizes understanding and agrees to plan. BMET scheduled and orders placed. Patient also agrees to check BP and HR daily for one week.

## 2023-05-24 NOTE — Addendum Note (Signed)
Addended by: Luellen Pucker on: 05/24/2023 10:07 AM   Modules accepted: Orders

## 2023-06-01 ENCOUNTER — Ambulatory Visit: Payer: BC Managed Care – PPO | Attending: Cardiovascular Disease | Admitting: *Deleted

## 2023-06-01 DIAGNOSIS — Z79899 Other long term (current) drug therapy: Secondary | ICD-10-CM | POA: Diagnosis not present

## 2023-06-02 LAB — BASIC METABOLIC PANEL
BUN/Creatinine Ratio: 9 — ABNORMAL LOW (ref 10–24)
BUN: 12 mg/dL (ref 8–27)
CO2: 21 mmol/L (ref 20–29)
Calcium: 9.1 mg/dL (ref 8.6–10.2)
Chloride: 106 mmol/L (ref 96–106)
Creatinine, Ser: 1.31 mg/dL — ABNORMAL HIGH (ref 0.76–1.27)
Glucose: 96 mg/dL (ref 70–99)
Potassium: 3.7 mmol/L (ref 3.5–5.2)
Sodium: 140 mmol/L (ref 134–144)
eGFR: 57 mL/min/{1.73_m2} — ABNORMAL LOW (ref >59)

## 2023-06-05 NOTE — Telephone Encounter (Signed)
Patient has turned in the following BP log after stopping chlorthalidone and increasing Toprol XL to 50 mg daily as of 05/24/23:  05/28/23: 118/72 HR 65  05/29/23: 132/72 HR 91  05/30/23: 135/79 HR 75  05/31/23: 125/76 HR 63  06/01/23: 146/86 HR 62

## 2023-06-12 ENCOUNTER — Telehealth: Payer: Self-pay

## 2023-06-12 MED ORDER — METOPROLOL SUCCINATE ER 50 MG PO TB24: 50.0000 mg | ORAL_TABLET | Freq: Every day | ORAL | 3 refills | Status: DC

## 2023-06-12 NOTE — Addendum Note (Signed)
Addended by: Luellen Pucker on: 06/12/2023 04:29 PM   Modules accepted: Orders

## 2023-06-12 NOTE — Telephone Encounter (Signed)
-----   Message from Lendon Ka, RN sent at 06/05/2023  1:34 PM EDT -----  ----- Message ----- From: Quintella Reichert, MD Sent: 06/04/2023   5:00 PM EDT To: [facility] Triage  Creatinine improved after stopping diuretic - encouraged patient to stay hydrated and avoid NSAIDs>>forward to PCP

## 2023-06-12 NOTE — Telephone Encounter (Signed)
No PCP on  file, called to ask patient if he has a PCP. NO answer, left detailed message per DPR asking patient to provide [place] with name of PCP.

## 2023-06-12 NOTE — Telephone Encounter (Signed)
Reviewed BP log and Dr. Norris Cross recommendations to continue medication regimen. Patient requests new script to be sent to his local pharmacy as he is almost out of toprol xl 25mg  since he double his dose. Order placed.  Also reviewed BMET results. Advised patient that Cr improved after stopping diuretics, patient is to avoid NSAIDS and keep hydrated. Patient verbalizes understanding. Labs forwarded to PCP.

## 2023-07-02 ENCOUNTER — Telehealth: Payer: Self-pay | Admitting: Cardiology

## 2023-07-02 ENCOUNTER — Other Ambulatory Visit: Payer: Self-pay | Admitting: *Deleted

## 2023-07-02 MED ORDER — METOPROLOL SUCCINATE ER 50 MG PO TB24: 50.0000 mg | ORAL_TABLET | Freq: Every day | ORAL | 3 refills | Status: DC

## 2023-07-02 NOTE — Telephone Encounter (Signed)
*  STAT* If patient is at the pharmacy, call can be transferred to refill team.   1. Which medications need to be refilled? (please list name of each medication and dose if known)   metoprolol succinate (TOPROL XL) 50 MG 24 hr tablet    2. Which pharmacy/location (including street and city if local pharmacy) is medication to be sent to?[facility] - [place], [place] - [address]   3. Do they need a 30 day or 90 day supply? 90 Day Supply

## 2023-07-26 ENCOUNTER — Other Ambulatory Visit: Payer: Self-pay | Admitting: Cardiology

## 2023-11-13 NOTE — Patient Instructions (Signed)
A user error has taken place: encounter opened in error, closed for administrative reasons.

## 2024-04-12 ENCOUNTER — Other Ambulatory Visit: Payer: Self-pay | Admitting: Cardiology

## 2024-06-12 ENCOUNTER — Telehealth: Payer: Self-pay | Admitting: Cardiology

## 2024-06-12 MED ORDER — LISINOPRIL 10 MG PO TABS: 10.0000 mg | ORAL_TABLET | Freq: Every day | ORAL | 0 refills | Status: DC

## 2024-06-12 MED ORDER — METOPROLOL SUCCINATE ER 50 MG PO TB24: 50.0000 mg | ORAL_TABLET | Freq: Every day | ORAL | 0 refills | Status: DC

## 2024-06-12 NOTE — Telephone Encounter (Signed)
 Pt's medications were sent to pt's pharmacy as requested. Confirmation received.

## 2024-06-12 NOTE — Telephone Encounter (Signed)
*  STAT* If patient is at the pharmacy, call can be transferred to refill team.   1. Which medications need to be refilled? (please list name of each medication and dose if known) Metoprolol  and Lisinopril   2. Would you like to learn more about the convenience, safety, & potential cost savings by using the [facility]?    3. Are you open to using the [facility] (Type [facility].    4. Which pharmacy/location (including street and city if local pharmacy) is medication to be sent to?[facility]  [address] [place]   5. Do they need a 30 day or 90 day supply? Need enough until his appointment on 07-24-24- just recently received his new insurance through his new job

## 2024-07-24 ENCOUNTER — Encounter: Payer: Self-pay | Admitting: Cardiology

## 2024-07-24 ENCOUNTER — Ambulatory Visit: Attending: Cardiology | Admitting: Cardiology

## 2024-07-24 VITALS — BP 140/80 | HR 61 | Ht 70.0 in | Wt 179.4 lb

## 2024-07-24 DIAGNOSIS — I493 Ventricular premature depolarization: Secondary | ICD-10-CM | POA: Diagnosis not present

## 2024-07-24 DIAGNOSIS — Z9189 Other specified personal risk factors, not elsewhere classified: Secondary | ICD-10-CM

## 2024-07-24 DIAGNOSIS — I10 Essential (primary) hypertension: Secondary | ICD-10-CM | POA: Diagnosis not present

## 2024-07-24 NOTE — Patient Instructions (Signed)
 Medication Instructions:  No  Changes *If you need a refill on your cardiac medications before your next appointment, please call your pharmacy*  Lab Work: Today: BMET (to check kidneys and electrolytes)  If you have labs (blood work) drawn today and your tests are completely normal, you will receive your results only by: MyChart Message (if you have MyChart) OR A paper copy in the mail If you have any lab test that is abnormal or we need to change your treatment, we will call you to review the results.  Testing/Procedures: Dr. Wilbert Bihari has ordered a CT coronary calcium score.   Test locations:  [facility] at [facility] [place] [facility] [address] [place]  [place] [facility] at [facility]  This is $99 out of pocket.   Coronary CalciumScan A coronary calcium scan is an imaging test used to look for deposits of calcium and other fatty materials (plaques) in the inner lining of the blood vessels of the heart (coronary arteries). These deposits of calcium and plaques can partly clog and narrow the coronary arteries without producing any symptoms or warning signs. This puts a person at risk for a heart attack. This test can detect these deposits before symptoms develop. Tell a health care provider about: Any allergies you have. All medicines you are taking, including vitamins, herbs, eye drops, creams, and over-the-counter medicines. Any problems you or family members have had with anesthetic medicines. Any blood disorders you have. Any surgeries you have had. Any medical conditions you have. Whether you are pregnant or may be pregnant. What are the risks? Generally, this is a safe procedure. However, problems may occur, including: Harm to a pregnant woman and her unborn baby. This test involves the use of radiation. Radiation exposure can be dangerous to a pregnant woman and her unborn baby. If you are pregnant, you  generally should not have this procedure done. Slight increase in the risk of cancer. This is because of the radiation involved in the test. What happens before the procedure? No preparation is needed for this procedure. What happens during the procedure? You will undress and remove any jewelry around your neck or chest. You will put on a hospital gown. Sticky electrodes will be placed on your chest. The electrodes will be connected to an electrocardiogram (ECG) machine to record a tracing of the electrical activity of your heart. A CT scanner will take pictures of your heart. During this time, you will be asked to lie still and hold your breath for 2-3 seconds while a picture of your heart is being taken. The procedure may vary among health care providers and hospitals. What happens after the procedure? You can get dressed. You can return to your normal activities. It is up to you to get the results of your test. Ask your health care provider, or the department that is doing the test, when your results will be ready. Summary A coronary calcium scan is an imaging test used to look for deposits of calcium and other fatty materials (plaques) in the inner lining of the blood vessels of the heart (coronary arteries). Generally, this is a safe procedure. Tell your health care provider if you are pregnant or may be pregnant. No preparation is needed for this procedure. A CT scanner will take pictures of your heart. You can return to your normal activities after the scan is done. This information is not intended to replace advice given to you by your health  care provider. Make sure you discuss any questions you have with your health care provider. Document Released: 05/18/2008 Document Revised: 10/09/2016 Document Reviewed: 10/09/2016 Elsevier Interactive Patient Education  2017 [employer]vier [employer] D  Follow-Up: At [facility], you and your health needs are our priority.  As part of our  continuing mission to provide you with exceptional heart care, our providers are all part of one team.  This team includes your primary Cardiologist (physician) and Advanced Practice Providers or APPs (Physician Assistants and Nurse Practitioners) who all work together to provide you with the care you need, when you need it.  Your next appointment:   1 year(s)  Provider:   Wilbert Bihari, MD

## 2024-07-24 NOTE — Progress Notes (Signed)
 Date:  07/24/2024   ID:  Steven Dorsey, DOB March 23, 1946, MRN [id number]  PCP:  Patient, No Pcp Per  Cardiologist:  Wilbert Bihari, MD  Electrophysiologist:  None   Chief Complaint:  PVCs and HTN  History of Present Illness:    Steven Dorsey is a 78 y.o. male  with a hx of PVCs and HTN.  He is here today and doing well.  He denies any chest pain or pressure, SOB, DOE, PND, orthopnea, lower extremity edema, dizziness, palpitations or syncope   Prior CV studies:   The following studies were reviewed today:  EKG   Past Medical History:  Diagnosis Date   Hx of echocardiogram    Echo (06/27/07):  EF 55%, mild aortic sclerosis   PVC's (premature ventricular contractions)    Past Surgical History:  Procedure Laterality Date   MOUTH SURGERY     Had a tooth cut out     Current Meds  Medication Sig   Cyanocobalamin (B-12) 2500 MCG TABS Take 2,500 mcg by mouth daily.   lisinopril  (ZESTRIL ) 10 MG tablet Take 1 tablet (10 mg total) by mouth daily.   metoprolol  succinate (TOPROL  XL) 50 MG 24 hr tablet Take 1 tablet (50 mg total) by mouth daily. Take with or immediately following a meal.   Multiple Vitamin (MULTIVITAMIN) tablet Take 1 tablet by mouth daily.     Allergies:   Patient has no known allergies.   Social History   Tobacco Use   Smoking status: Former    Types: Cigars    Quit date: 07/04/2020    Years since quitting: 4.0   Smokeless tobacco: Never  Vaping Use   Vaping status: Never Used  Substance Use Topics   Alcohol use: Yes    Alcohol/week: 8.0 standard drinks of alcohol    Types: 8 Cans of beer per week   Drug use: Not Currently     Family Hx: The patient's family history includes Other in his father and mother; Stroke in his sister.  ROS:   Please see the history of present illness.     All other systems reviewed and are negative.   Labs/Other Tests and Data Reviewed:    Recent Labs: No results found for requested labs within last 365 days.    Recent Lipid Panel No results found for: CHOL, TRIG, HDL, CHOLHDL, LDLCALC, LDLDIRECT  Wt Readings from Last 3 Encounters:  07/24/24 179 lb 6.4 oz (81.4 kg)  05/11/23 181 lb 9.6 oz (82.4 kg)  02/08/22 174 lb 12.8 oz (79.3 kg)    EKG Interpretation Date/Time:  Thursday July 24 2024 07:41:21 EDT Ventricular Rate:  61 PR Interval:  162 QRS Duration:  86 QT Interval:  392 QTC Calculation: 394 R Axis:   12  Text Interpretation: Normal sinus rhythm Normal ECG When compared with ECG of 18-May-2007 11:45, Premature ventricular complexes are no longer Present Questionable change in QRS axis T wave amplitude has increased in Lateral leads Confirmed by Bihari Wilbert ([id number]) on 07/24/2024 8:04:23 AM    Objective:    Vital Signs:  BP (!) 140/80   Pulse 61   Ht 5' 10 (1.778 m)   Wt 179 lb 6.4 oz (81.4 kg)   SpO2 98%   BMI 25.74 kg/m    GEN: Well nourished, well developed in no acute distress HEENT: Normal NECK: No JVD; No carotid bruits LYMPHATICS: No lymphadenopathy CARDIAC:RRR, no murmurs, rubs, gallops RESPIRATORY:  Clear to auscultation without rales, wheezing or rhonchi  ABDOMEN: Soft, non-tender, non-distended MUSCULOSKELETAL:  No edema; No deformity  SKIN: Warm and dry NEUROLOGIC:  Alert and oriented x 3 PSYCHIATRIC:  Normal affect  ASSESSMENT & PLAN:    1.  HTN - Blood pressure is controlled on exam today - Continue lisinopril  10 mg daily and Toprol -XL 50 mg daily with as needed refills - Check BMET  2.  PVCs - His palpitations are very well-controlled on beta-blocker therapy - Continue Toprol -XL 50 mg daily with as needed refills  3.  Cardiac risk factors - he has HTN and hx of tobacco use - I have recommended at coronary Ca score >>he will proceed in a little while once his finances are more stable  Medication Adjustments/Labs and Tests Ordered: Current medicines are reviewed at length with the patient today.  Concerns regarding medicines are  outlined above.  Tests Ordered: Orders Placed This Encounter  Procedures   EKG 12-Lead   Medication Changes: No orders of the defined types were placed in this encounter.   Disposition:  Follow up in 1 year(s)  Signed, Wilbert Bihari, MD  07/24/2024 8:11 AM    [facility]

## 2024-07-25 ENCOUNTER — Ambulatory Visit: Payer: Self-pay | Admitting: Cardiology

## 2024-07-25 LAB — BASIC METABOLIC PANEL WITH GFR
BUN/Creatinine Ratio: 10 (ref 10–24)
BUN: 14 mg/dL (ref 8–27)
CO2: 21 mmol/L (ref 20–29)
Calcium: 10.1 mg/dL (ref 8.6–10.2)
Chloride: 103 mmol/L (ref 96–106)
Creatinine, Ser: 1.35 mg/dL — ABNORMAL HIGH (ref 0.76–1.27)
Glucose: 98 mg/dL (ref 70–99)
Potassium: 4.6 mmol/L (ref 3.5–5.2)
Sodium: 140 mmol/L (ref 134–144)
eGFR: 54 mL/min/1.73 — ABNORMAL LOW (ref >59)

## 2024-09-07 ENCOUNTER — Other Ambulatory Visit: Payer: Self-pay | Admitting: Cardiology
